# Patient Record
Sex: Female | Born: 1990 | Race: Black or African American | Hispanic: No | Marital: Single | Smoking: Former smoker
Health system: Southern US, Community
[De-identification: ages and names within clinical notes are randomized; demographics above are authoritative.]

## PROBLEM LIST (undated history)

## (undated) ENCOUNTER — Inpatient Hospital Stay (HOSPITAL_COMMUNITY): Payer: Self-pay

## (undated) DIAGNOSIS — A749 Chlamydial infection, unspecified: Secondary | ICD-10-CM

## (undated) DIAGNOSIS — A549 Gonococcal infection, unspecified: Secondary | ICD-10-CM

## (undated) DIAGNOSIS — E559 Vitamin D deficiency, unspecified: Secondary | ICD-10-CM

## (undated) HISTORY — PX: NO PAST SURGERIES: SHX2092

---

## 2010-02-05 ENCOUNTER — Emergency Department (HOSPITAL_COMMUNITY): Admission: EM | Admit: 2010-02-05 | Discharge: 2010-02-05 | Payer: Self-pay | Admitting: Family Medicine

## 2010-02-08 ENCOUNTER — Inpatient Hospital Stay (HOSPITAL_COMMUNITY): Admission: AD | Admit: 2010-02-08 | Discharge: 2010-02-08 | Payer: Self-pay | Admitting: Obstetrics & Gynecology

## 2010-07-04 ENCOUNTER — Inpatient Hospital Stay (HOSPITAL_COMMUNITY)
Admission: AD | Admit: 2010-07-04 | Discharge: 2010-07-04 | Payer: Self-pay | Source: Home / Self Care | Attending: Obstetrics & Gynecology | Admitting: Obstetrics & Gynecology

## 2010-07-05 ENCOUNTER — Other Ambulatory Visit: Payer: Self-pay | Admitting: Family Medicine

## 2010-07-05 DIAGNOSIS — O480 Post-term pregnancy: Secondary | ICD-10-CM

## 2010-07-07 ENCOUNTER — Other Ambulatory Visit: Payer: Self-pay

## 2010-07-07 ENCOUNTER — Inpatient Hospital Stay (HOSPITAL_COMMUNITY)
Admission: AD | Admit: 2010-07-07 | Discharge: 2010-07-10 | DRG: 774 | Disposition: A | Discharge status: Home / Self Care | Payer: Medicaid Other | Source: Ambulatory Visit | Attending: Family Medicine | Admitting: Family Medicine

## 2010-07-07 DIAGNOSIS — D649 Anemia, unspecified: Secondary | ICD-10-CM | POA: Diagnosis not present

## 2010-07-07 DIAGNOSIS — O4590 Premature separation of placenta, unspecified, unspecified trimester: Secondary | ICD-10-CM | POA: Diagnosis present

## 2010-07-07 DIAGNOSIS — O9903 Anemia complicating the puerperium: Secondary | ICD-10-CM | POA: Diagnosis not present

## 2010-07-07 LAB — CBC
HCT: 34.8 % — ABNORMAL LOW (ref 36.0–46.0)
Hemoglobin: 11.7 g/dL — ABNORMAL LOW (ref 12.0–15.0)
MCHC: 33.6 g/dL (ref 30.0–36.0)
MCV: 81.3 fL (ref 78.0–100.0)
WBC: 11.8 10*3/uL — ABNORMAL HIGH (ref 4.0–10.5)

## 2010-07-08 DIAGNOSIS — D649 Anemia, unspecified: Secondary | ICD-10-CM

## 2010-07-08 DIAGNOSIS — O4590 Premature separation of placenta, unspecified, unspecified trimester: Secondary | ICD-10-CM

## 2010-07-08 DIAGNOSIS — O9903 Anemia complicating the puerperium: Secondary | ICD-10-CM

## 2010-07-08 LAB — ABO/RH: ABO/RH(D): O POS

## 2010-07-09 LAB — CBC
HCT: 25.3 % — ABNORMAL LOW (ref 36.0–46.0)
Hemoglobin: 8.1 g/dL — ABNORMAL LOW (ref 12.0–15.0)
MCV: 84.9 fL (ref 78.0–100.0)
WBC: 13 10*3/uL — ABNORMAL HIGH (ref 4.0–10.5)

## 2010-07-11 ENCOUNTER — Ambulatory Visit (HOSPITAL_COMMUNITY): Payer: Medicaid Other | Attending: Family Medicine

## 2010-08-18 LAB — GC/CHLAMYDIA PROBE AMP, GENITAL
Chlamydia, DNA Probe: NEGATIVE
GC Probe Amp, Genital: NEGATIVE

## 2010-08-18 LAB — URINALYSIS, ROUTINE W REFLEX MICROSCOPIC
Glucose, UA: NEGATIVE mg/dL
Hgb urine dipstick: NEGATIVE
Specific Gravity, Urine: 1.02 (ref 1.005–1.030)

## 2010-08-18 LAB — WET PREP, GENITAL
Clue Cells Wet Prep HPF POC: NONE SEEN
Trich, Wet Prep: NONE SEEN
Yeast Wet Prep HPF POC: NONE SEEN

## 2013-02-05 ENCOUNTER — Emergency Department (HOSPITAL_COMMUNITY): Payer: Self-pay

## 2013-02-05 ENCOUNTER — Encounter (HOSPITAL_COMMUNITY): Payer: Self-pay | Admitting: Emergency Medicine

## 2013-02-05 ENCOUNTER — Emergency Department (HOSPITAL_COMMUNITY)
Admission: EM | Admit: 2013-02-05 | Discharge: 2013-02-05 | Disposition: A | Discharge status: Home / Self Care | Payer: Self-pay | Attending: Emergency Medicine | Admitting: Emergency Medicine

## 2013-02-05 DIAGNOSIS — Z8619 Personal history of other infectious and parasitic diseases: Secondary | ICD-10-CM | POA: Insufficient documentation

## 2013-02-05 DIAGNOSIS — Z3202 Encounter for pregnancy test, result negative: Secondary | ICD-10-CM | POA: Insufficient documentation

## 2013-02-05 DIAGNOSIS — N760 Acute vaginitis: Secondary | ICD-10-CM | POA: Insufficient documentation

## 2013-02-05 DIAGNOSIS — A499 Bacterial infection, unspecified: Secondary | ICD-10-CM | POA: Insufficient documentation

## 2013-02-05 DIAGNOSIS — N390 Urinary tract infection, site not specified: Secondary | ICD-10-CM | POA: Insufficient documentation

## 2013-02-05 DIAGNOSIS — B9689 Other specified bacterial agents as the cause of diseases classified elsewhere: Secondary | ICD-10-CM | POA: Insufficient documentation

## 2013-02-05 DIAGNOSIS — Z202 Contact with and (suspected) exposure to infections with a predominantly sexual mode of transmission: Secondary | ICD-10-CM | POA: Insufficient documentation

## 2013-02-05 DIAGNOSIS — R112 Nausea with vomiting, unspecified: Secondary | ICD-10-CM | POA: Insufficient documentation

## 2013-02-05 DIAGNOSIS — R51 Headache: Secondary | ICD-10-CM | POA: Insufficient documentation

## 2013-02-05 HISTORY — DX: Gonococcal infection, unspecified: A54.9

## 2013-02-05 HISTORY — DX: Chlamydial infection, unspecified: A74.9

## 2013-02-05 LAB — CBC WITH DIFFERENTIAL/PLATELET
Basophils Absolute: 0 10*3/uL (ref 0.0–0.1)
Eosinophils Absolute: 0.1 10*3/uL (ref 0.0–0.7)
Eosinophils Relative: 1 % (ref 0–5)
Lymphocytes Relative: 45 % (ref 12–46)
MCV: 83.1 fL (ref 78.0–100.0)
Platelets: 170 10*3/uL (ref 150–400)
RDW: 14.1 % (ref 11.5–15.5)
WBC: 4.6 10*3/uL (ref 4.0–10.5)

## 2013-02-05 LAB — URINE MICROSCOPIC-ADD ON

## 2013-02-05 LAB — COMPREHENSIVE METABOLIC PANEL
ALT: 8 U/L (ref 0–35)
AST: 18 U/L (ref 0–37)
CO2: 24 mEq/L (ref 19–32)
Calcium: 9 mg/dL (ref 8.4–10.5)
Sodium: 138 mEq/L (ref 135–145)
Total Protein: 7.3 g/dL (ref 6.0–8.3)

## 2013-02-05 LAB — WET PREP, GENITAL: Trich, Wet Prep: NONE SEEN

## 2013-02-05 LAB — POCT PREGNANCY, URINE: Preg Test, Ur: NEGATIVE

## 2013-02-05 LAB — URINALYSIS, ROUTINE W REFLEX MICROSCOPIC
Bilirubin Urine: NEGATIVE
Glucose, UA: NEGATIVE mg/dL
Ketones, ur: NEGATIVE mg/dL
Protein, ur: NEGATIVE mg/dL

## 2013-02-05 MED ORDER — CEPHALEXIN 500 MG PO CAPS: 500.0000 mg | ORAL_CAPSULE | Freq: Four times a day (QID) | ORAL | Status: DC

## 2013-02-05 MED ORDER — METRONIDAZOLE 500 MG PO TABS: 500.0000 mg | ORAL_TABLET | Freq: Two times a day (BID) | ORAL | Status: DC

## 2013-02-05 MED ORDER — AZITHROMYCIN 1 G PO PACK
1.0000 g | PACK | Freq: Once | ORAL | Status: AC
  Administered 2013-02-05: 1 g via ORAL
  Filled 2013-02-05: qty 1

## 2013-02-05 MED ORDER — CEFTRIAXONE SODIUM 250 MG IJ SOLR
250.0000 mg | Freq: Once | INTRAMUSCULAR | Status: AC
  Administered 2013-02-05: 250 mg via INTRAMUSCULAR
  Filled 2013-02-05: qty 250

## 2013-02-05 NOTE — ED Notes (Signed)
Per EMS: Pt was dx w/ gonorrhea/chlamydia x 1 wk ago.  States that she got better and is now having hematuria and low abd pain.  States it feels the same as when she had ovarian cysts and also when she was pregnant.

## 2013-02-05 NOTE — Progress Notes (Signed)
[facility] did not get to see patient but will be sending information about the [facility] Orange Card program, using the address provided.  °

## 2013-02-05 NOTE — ED Provider Notes (Signed)
CSN: [id number]     Arrival date & time 02/05/13  1038 History   First MD Initiated Contact with Patient 02/05/13 1042     Chief Complaint  Patient presents with  . SEXUALLY TRANSMITTED DISEASE  . Hematuria   (Consider location/radiation/quality/duration/timing/severity/associated sxs/prior Treatment) The history is provided by the patient. No language interpreter was used.  Kristin Garner is a 22 year old female, G1P1001,with past medical history of gonorrhea and chlamydia just recently diagnosed approximately one week ago-as per patient was given ceftriaxone and azithromycin for treatment-presenting to emergency department, brought in by EMS, with vaginal pain, bilateral pelvic pain, suprapubic pain, vaginal discharge, hematuria. Patient reported that the pelvic pain, suprapubic pain, vaginal pain started a couple of days ag. Patient described the pelvic discomfort to be a constant pressure and cramping sensation with radiation to the lower back. Patient reported that the vaginal discomfort is more of a burning sensation, reported that she feels that she has bumps located on her vagina-reported that the discomfort worsens with urination. Patient reported that she's been experiencing vaginal discharge, described as a white creamy consistency with a fishy odor it has been ongoing for the past 2-3 days. Patient reported that when she urinated this morning she had blood in her urine for when she went there is nothing new. Patient reported that she took ibuprofen last night reporting that the pain somewhat subsided, but woke up this morning with increased pain. Patient reported that she's been vomiting, reported that she vomited twice today. Stated that she's been feeling rather nauseous every morning for the past 2-3 days. Stated that she has associated symptoms of headache. Patient reported that her last period was 01/16/2013, stated that her periods are regular. When asked regarding sexually active patient  reported that she is sexually active, uses protection, denied birth control-reported that last sexual encounter was 01/03/2013. Denied fever, chills, abdominal pain, diarrhea, neck pain, neck stiffness, vaginal bleeding, numbness, tingling, chest pain shortness of breath, difficulty breathing, hematochezia, melena. PCP none  Past Medical History  Diagnosis Date  . Gonorrhea   . Chlamydia    No past surgical history on file. No family history on file. History  Substance Use Topics  . Smoking status: Not on file  . Smokeless tobacco: Not on file  . Alcohol Use: Not on file   OB History   Grav Para Term Preterm Abortions TAB SAB Ect Mult Living                 Review of Systems  Constitutional: Negative for fever and chills.  HENT: Negative for sore throat, trouble swallowing, neck pain and neck stiffness.   Eyes: Negative for visual disturbance.  Respiratory: Negative for shortness of breath.   Cardiovascular: Negative for chest pain.  Gastrointestinal: Positive for nausea and vomiting. Negative for abdominal pain, diarrhea, constipation, blood in stool and anal bleeding.  Genitourinary: Positive for hematuria, vaginal discharge, vaginal pain and pelvic pain. Negative for dysuria, decreased urine volume, vaginal bleeding and difficulty urinating.  Musculoskeletal: Positive for back pain.  Neurological: Positive for headaches. Negative for dizziness and weakness.  All other systems reviewed and are negative.    Allergies  Review of patient's allergies indicates no known allergies.  Home Medications   Current Outpatient Rx  Name  Route  Sig  Dispense  Refill  . ibuprofen (ADVIL,MOTRIN) 200 MG tablet   Oral   Take 400 mg by mouth every 8 (eight) hours as needed for pain.         Marland Kitchen  cephALEXin (KEFLEX) 500 MG capsule   Oral   Take 1 capsule (500 mg total) by mouth 4 (four) times daily.   40 capsule   0   . metroNIDAZOLE (FLAGYL) 500 MG tablet   Oral   Take 1 tablet  (500 mg total) by mouth 2 (two) times daily. One po bid x 7 days   14 tablet   0    BP 128/82  Pulse 70  Temp(Src) 98.2 F (36.8 C) (Oral)  Resp 20  SpO2 100% Physical Exam  Nursing note and vitals reviewed. Constitutional: She is oriented to person, place, and time. She appears well-developed and well-nourished. No distress.  HENT:  Head: Normocephalic and atraumatic.  Mouth/Throat: Oropharynx is clear and moist. No oropharyngeal exudate.  Negative oral lesions or sores noted to the gumline and buccal mucosa Negative erythema, inflammation, swelling, exudate, sores, lesions noted to the posterior oropharynx  Eyes: Conjunctivae and EOM are normal. Pupils are equal, round, and reactive to light. Right eye exhibits no discharge.  Neck: Normal range of motion. Neck supple.  Negative neck stiffness Negative nuchal rigidity Negative lymphadenopathy  Cardiovascular: Normal rate, regular rhythm and normal heart sounds.  Exam reveals no friction rub.   No murmur heard. Pulmonary/Chest: Effort normal and breath sounds normal. No respiratory distress. She has no wheezes. She has no rales.  Abdominal: Soft. Bowel sounds are normal. She exhibits no distension. There is tenderness. There is no rebound and no guarding.  Mild discomfort upon epigastric region  Tenderness upon palpation to the suprapubic region and bilateral adnexal discomfort Negative McBurney's point Negative Psoas and obturator sign  Genitourinary: Vaginal discharge found.  Mild swelling noted to the labia majora labia minora region. Negative inflammation, it erythema, sores, lesions noted to the external genitalia. Negative inflammation, lesions, sores, erythema, inflammation noted to the vaginal canal. Negative swelling, erythema, inflammation, strawberry appearance noted to the cervix. Negative blood in vaginal vault. Thick white discharge with no odor identified upon pelvic exam. Positive cervical motion tenderness  identified, positive bilateral adnexal tenderness upon palpation. Negative inguinal lymphadenopathy.  Exam chaperoned  Lymphadenopathy:    She has no cervical adenopathy.  Neurological: She is alert and oriented to person, place, and time. She exhibits normal muscle tone. Coordination normal.  Skin: Skin is warm and dry. No rash noted. She is not diaphoretic. No erythema.  Psychiatric: She has a normal mood and affect. Her behavior is normal. Thought content normal.    ED Course  Procedures (including critical care time)  1:00 PM Re-assessed patient. Patient presenting with right sided CVA tenderness - denied any history of kidney stones.   Labs Review Labs Reviewed  WET PREP, GENITAL - Abnormal; Notable for the following:    Clue Cells Wet Prep HPF POC MANY (*)    WBC, Wet Prep HPF POC FEW (*)    All other components within normal limits  URINALYSIS, ROUTINE W REFLEX MICROSCOPIC - Abnormal; Notable for the following:    APPearance CLOUDY (*)    Hgb urine dipstick LARGE (*)    Leukocytes, UA MODERATE (*)    All other components within normal limits  CBC WITH DIFFERENTIAL - Abnormal; Notable for the following:    Hemoglobin 11.4 (*)    HCT 35.3 (*)    All other components within normal limits  URINE MICROSCOPIC-ADD ON - Abnormal; Notable for the following:    Squamous Epithelial / LPF MANY (*)    Bacteria, UA MANY (*)    All other  components within normal limits  GC/CHLAMYDIA PROBE AMP  URINE CULTURE  COMPREHENSIVE METABOLIC PANEL  LIPASE, BLOOD  POCT PREGNANCY, URINE   Imaging Review US Renal  02/05/2013   CLINICAL DATA:  Right flank pain.  EXAM: RENAL/URINARY TRACT ULTRASOUND COMPLETE  COMPARISON:  None.  FINDINGS: Right Kidney: 10.6 cm. Normal size and echotexture. No focal abnormality. No hydronephrosis.  Left Kidney: 10.7 cm. Normal size and echotexture. No focal abnormality. No hydronephrosis.  Bladder:  Appears normal for degree of bladder distention.  IMPRESSION:  Normal study.   Electronically Signed   By: Charlett Nose   On: 02/05/2013 13:50    MDM   1. Bacterial vaginosis   2. Possible exposure to STD   3. UTI (urinary tract infection)     Patient presenting to emergency apartment with vaginal pain, suprapubic pain, bilateral pelvic pain with vaginal discharge that has been ongoing for the past week. Patient was recently seen and diagnosed last week with gonorrhea Chlamydia infection, patient was treated prophylactically in the emergency department. Alert and oriented. Negative acute abdomen, negative peritoneal signs. Mild discomfort upon palpation to epigastric region. Bowel sounds normoactive in all 4 quadrants. Soft. Discomfort upon palpation to suprapubic and bilateral pelvic regions. Mild swelling noted to the external genitalia. Negative sores, lesions, inflammation, swelling, erythema noted to the vaginal canal and cervix. Thick white discharge identified upon pelvic exam with no odor identified. CMT tenderness and bilateral adnexal tenderness noted upon palpation. CBC negative findings. CMP negative findings. Lipase negative elevation. Urine pregnancy negative. Urine large amount of hemoglobin identified with moderate leukocytosis and many bacteria identified in urine. Urine culture pending. Ultrasound negative identification of stones or hydronephrosis. Patient stable, afebrile. Patient given antibiotics prophylactically while in ED setting for GC/Chlamydia. Positive clue cells identified-bacterial vaginosis identified. Hemoglobin in urine suspicion to be possible passing stone with mild right-sided CVA tenderness identified. Urine identified numerous amounts of bacteria, possible urinary tract infection. Discharged patient with antibiotics for urinary tract infection-Keflex-and bacterial vaginosis-Flagyl. Referred patient to [facility] to be reevaluated. Discussed with patient to refrain from any sexual activity. Referred patient to STD clinic  for further followup and to be reevaluated-encouraged patient to followup with STD clinic for further testing to be performed. Discussed with patient to continue monitor symptoms and if symptoms are to worsen or change report back to emergency department gastric return instructions given.  Patient to plan of care, understood, all questions answered.   Raymon Mutton, PA-C 02/05/13 1845  Medical screening examination/treatment/procedure(s) were conducted as a shared visit with non-physician practitioner(s) or resident and myself. I personally evaluated the patient during the encounter and agree with the findings and plan unless otherwise indicated.  Hematuria and lower pelvic pain since treatment for STD G/C 1 wk prior. No fevers or vomiting, feels similar to ovarian cyst hx. Pain mild, worse with palpation. Vaginal discharge. No new sexual partners. Pt has been tested for HIV, neg per pt. Mild pelvic/ suprapubic pain, no guarding, well appearing, mmm. Plan for pelvic exam, UA/ pregnancy and PID treatment. UTI and BV.  Outpt fup discused.    Enid Skeens, MD 02/06/13 1255

## 2013-02-06 LAB — URINE CULTURE: Colony Count: >=100000

## 2013-02-06 LAB — GC/CHLAMYDIA PROBE AMP: CT Probe RNA: NEGATIVE

## 2013-05-05 ENCOUNTER — Emergency Department (HOSPITAL_COMMUNITY)
Admission: EM | Admit: 2013-05-05 | Discharge: 2013-05-05 | Disposition: A | Discharge status: Home / Self Care | Payer: Medicaid Other | Attending: Emergency Medicine | Admitting: Emergency Medicine

## 2013-05-05 ENCOUNTER — Encounter (HOSPITAL_COMMUNITY): Payer: Self-pay | Admitting: Emergency Medicine

## 2013-05-05 DIAGNOSIS — R51 Headache: Secondary | ICD-10-CM | POA: Insufficient documentation

## 2013-05-05 DIAGNOSIS — O210 Mild hyperemesis gravidarum: Secondary | ICD-10-CM | POA: Insufficient documentation

## 2013-05-05 DIAGNOSIS — R112 Nausea with vomiting, unspecified: Secondary | ICD-10-CM

## 2013-05-05 DIAGNOSIS — Z8619 Personal history of other infectious and parasitic diseases: Secondary | ICD-10-CM | POA: Insufficient documentation

## 2013-05-05 DIAGNOSIS — Z3490 Encounter for supervision of normal pregnancy, unspecified, unspecified trimester: Secondary | ICD-10-CM

## 2013-05-05 DIAGNOSIS — R509 Fever, unspecified: Secondary | ICD-10-CM | POA: Insufficient documentation

## 2013-05-05 DIAGNOSIS — Z87891 Personal history of nicotine dependence: Secondary | ICD-10-CM | POA: Insufficient documentation

## 2013-05-05 LAB — URINE MICROSCOPIC-ADD ON

## 2013-05-05 LAB — URINALYSIS, ROUTINE W REFLEX MICROSCOPIC
Hgb urine dipstick: NEGATIVE
Specific Gravity, Urine: 1.026 (ref 1.005–1.030)
Urobilinogen, UA: 1 mg/dL (ref 0.0–1.0)
pH: 6 (ref 5.0–8.0)

## 2013-05-05 LAB — POCT PREGNANCY, URINE: Preg Test, Ur: POSITIVE — AB

## 2013-05-05 MED ORDER — ONDANSETRON 4 MG PO TBDP
4.0000 mg | ORAL_TABLET | Freq: Once | ORAL | Status: AC
  Administered 2013-05-05: 4 mg via ORAL
  Filled 2013-05-05: qty 1

## 2013-05-05 MED ORDER — ONDANSETRON 4 MG PO TBDP: 4.0000 mg | ORAL_TABLET | Freq: Three times a day (TID) | ORAL | Status: DC | PRN

## 2013-05-05 MED ORDER — ACETAMINOPHEN 325 MG PO TABS
650.0000 mg | ORAL_TABLET | Freq: Once | ORAL | Status: AC
  Administered 2013-05-05: 650 mg via ORAL
  Filled 2013-05-05: qty 2

## 2013-05-05 NOTE — ED Notes (Addendum)
Pt c/o nausea and vomiting since this am.  St's she may be pregnant, did not have a period in Nov.  Denies any diarrhea.   Also c/o lower back pain x's 3 weeks and headache x's 2 days

## 2013-05-05 NOTE — ED Provider Notes (Signed)
Medical screening examination/treatment/procedure(s) were performed by non-physician practitioner and as supervising physician I was immediately available for consultation/collaboration.  EKG Interpretation   None         Gilda Crease, MD 05/05/13 2245

## 2013-05-05 NOTE — ED Provider Notes (Signed)
CSN: [id number]     Arrival date & time 05/05/13  1807 History   First MD Initiated Contact with Patient 05/05/13 2152     Chief Complaint  Patient presents with  . Emesis   (Consider location/radiation/quality/duration/timing/severity/associated sxs/prior Treatment) HPI Comments: Patient's LMP was October 15, took 2 home pregnancy test first negative the last taken 3 days ago positive since that time has had intermittent bouts of nausea and vomiting  Today worse.  Uses no form of birth control has a 22 years old-- normal pregnancy and delivery.  States today has a headache- has not taken any meds, denies abdominal pain , dysuria, vaginal discharge, vaginal bleeding   Patient is a 22 y.o. female presenting with vomiting. The history is provided by the patient.  Emesis Severity:  Mild Duration:  3 days Timing:  Intermittent Quality:  Bilious material Progression:  Worsening Chronicity:  New Recent urination:  Normal Relieved by:  None tried Worsened by:  Food smell Ineffective treatments:  None tried Associated symptoms: headaches   Associated symptoms: no abdominal pain, no chills, no diarrhea, no fever and no myalgias   Headaches:    Severity:  Mild   Onset quality:  Gradual   Duration:  1 day   Timing:  Unable to specify   Past Medical History  Diagnosis Date  . Gonorrhea   . Chlamydia    History reviewed. No pertinent past surgical history. No family history on file. History  Substance Use Topics  . Smoking status: Former Games developer  . Smokeless tobacco: Not on file  . Alcohol Use: No   OB History   Grav Para Term Preterm Abortions TAB SAB Ect Mult Living                 Review of Systems  Constitutional: Positive for fever. Negative for chills.  Eyes: Negative for visual disturbance.  Respiratory: Negative for shortness of breath.   Gastrointestinal: Positive for nausea and vomiting. Negative for abdominal pain and diarrhea.  Genitourinary: Negative for dysuria,  frequency, vaginal bleeding and vaginal discharge.  Musculoskeletal: Negative for myalgias.  Neurological: Positive for headaches. Negative for dizziness.  All other systems reviewed and are negative.    Allergies  Review of patient's allergies indicates no known allergies.  Home Medications   Current Outpatient Rx  Name  Route  Sig  Dispense  Refill  . ondansetron (ZOFRAN-ODT) 4 MG disintegrating tablet   Oral   Take 1 tablet (4 mg total) by mouth every 8 (eight) hours as needed for nausea or vomiting.   20 tablet   0    BP 129/49  Pulse 81  Temp(Src) 98.4 F (36.9 C) (Oral)  Resp 18  Wt 125 lb 8 oz (56.926 kg)  SpO2 100%  LMP 03/19/2013 Physical Exam  Nursing note and vitals reviewed. Constitutional: She is oriented to person, place, and time. She appears well-nourished.  HENT:  Head: Normocephalic.  Eyes: Pupils are equal, round, and reactive to light.  Neck: Normal range of motion.  Cardiovascular: Normal rate.   Pulmonary/Chest: Effort normal and breath sounds normal.  Neurological: She is alert and oriented to person, place, and time.  Skin: Skin is warm and dry.    ED Course  Procedures (including critical care time) Labs Review Labs Reviewed  URINALYSIS, ROUTINE W REFLEX MICROSCOPIC - Abnormal; Notable for the following:    APPearance CLOUDY (*)    Ketones, ur 15 (*)    Leukocytes, UA SMALL (*)  All other components within normal limits  URINE MICROSCOPIC-ADD ON - Abnormal; Notable for the following:    Squamous Epithelial / LPF MANY (*)    All other components within normal limits  POCT PREGNANCY, URINE - Abnormal; Notable for the following:    Preg Test, Ur POSITIVE (*)    All other components within normal limits  URINE CULTURE   Imaging Review No results found.  EKG Interpretation   None       MDM   1. Pregnancy   2. Nausea & vomiting        Arman Filter, NP 05/05/13 2241

## 2013-05-07 LAB — URINE CULTURE

## 2013-05-12 ENCOUNTER — Inpatient Hospital Stay (HOSPITAL_COMMUNITY): Payer: Medicaid Other

## 2013-05-12 ENCOUNTER — Inpatient Hospital Stay (HOSPITAL_COMMUNITY)
Admission: AD | Admit: 2013-05-12 | Discharge: 2013-05-12 | Disposition: A | Discharge status: Home / Self Care | Payer: Medicaid Other | Source: Ambulatory Visit | Attending: Obstetrics & Gynecology | Admitting: Obstetrics & Gynecology

## 2013-05-12 ENCOUNTER — Encounter (HOSPITAL_COMMUNITY): Payer: Self-pay | Admitting: *Deleted

## 2013-05-12 DIAGNOSIS — Z1389 Encounter for screening for other disorder: Secondary | ICD-10-CM

## 2013-05-12 DIAGNOSIS — Z3490 Encounter for supervision of normal pregnancy, unspecified, unspecified trimester: Secondary | ICD-10-CM

## 2013-05-12 DIAGNOSIS — R109 Unspecified abdominal pain: Secondary | ICD-10-CM | POA: Insufficient documentation

## 2013-05-12 DIAGNOSIS — O210 Mild hyperemesis gravidarum: Secondary | ICD-10-CM | POA: Insufficient documentation

## 2013-05-12 DIAGNOSIS — O219 Vomiting of pregnancy, unspecified: Secondary | ICD-10-CM

## 2013-05-12 LAB — CBC
Platelets: 190 10*3/uL (ref 150–400)
RBC: 4.12 MIL/uL (ref 3.87–5.11)
WBC: 11.2 10*3/uL — ABNORMAL HIGH (ref 4.0–10.5)

## 2013-05-12 LAB — URINALYSIS, ROUTINE W REFLEX MICROSCOPIC
Hgb urine dipstick: NEGATIVE
Nitrite: NEGATIVE
Specific Gravity, Urine: >1.03 — ABNORMAL HIGH (ref 1.005–1.030)
Urobilinogen, UA: 0.2 mg/dL (ref 0.0–1.0)
pH: 6 (ref 5.0–8.0)

## 2013-05-12 LAB — WET PREP, GENITAL: Trich, Wet Prep: NONE SEEN

## 2013-05-12 LAB — HCG, QUANTITATIVE, PREGNANCY: hCG, Beta Chain, Quant, S: 140011 m[IU]/mL — ABNORMAL HIGH (ref <5)

## 2013-05-12 MED ORDER — PROMETHAZINE HCL 25 MG PO TABS: 12.5000 mg | ORAL_TABLET | Freq: Four times a day (QID) | ORAL | Status: DC | PRN

## 2013-05-12 NOTE — MAU Note (Signed)
Pt had + preg test at [facility] on Dec 1st. Started with low abd cramping last night, did not take anything for pain. Denies vag bleeding or discharge.

## 2013-05-12 NOTE — MAU Provider Note (Signed)
Chief Complaint: No chief complaint on file.   First Provider Initiated Contact with Patient 05/12/13 1848     SUBJECTIVE HPI: [name] is a 22 y.o. G2P1001 at [redacted]w[redacted]d by LMP who presents to maternity admissions reporting abdominal cramping x2-3 days.  She reports vomiting x1 today and she saw scant bleeding in emesis.  She denies epigastric pain/heartburn.  She denies vaginal bleeding, vaginal itching/burning/odor, urinary symptoms, h/a, dizziness, n/v, or fever/chills.     Patient's last menstrual period was 03/19/2013.  Past Medical History  Diagnosis Date  . Gonorrhea   . Chlamydia    Past Surgical History  Procedure Laterality Date  . No past surgeries     History   Social History  . Marital Status: Single    Spouse Name: N/A    Number of Children: N/A  . Years of Education: N/A   Occupational History  . Not on file.   Social History Main Topics  . Smoking status: Former Smoker -- 0.25 packs/day for 1 years    Types: Cigarettes    Quit date: 02/10/2013  . Smokeless tobacco: Not on file  . Alcohol Use: Yes     Comment: last use Nov 2014  . Drug Use: Yes    Special: Marijuana     Comment: last use Oct 2014  . Sexual Activity: Yes     Comment: Last sex in October   Other Topics Concern  . Not on file   Social History Narrative  . No narrative on file   No current facility-administered medications on file prior to encounter.   Current Outpatient Prescriptions on File Prior to Encounter  Medication Sig Dispense Refill  . ondansetron (ZOFRAN-ODT) 4 MG disintegrating tablet Take 1 tablet (4 mg total) by mouth every 8 (eight) hours as needed for nausea or vomiting.  20 tablet  0   No Known Allergies  ROS: Pertinent items in HPI  OBJECTIVE Blood pressure 120/68, pulse 81, temperature 99.3 F (37.4 C), temperature source Oral, resp. rate 16, height 5\' 5"  (1.651 m), weight 56.7 kg (125 lb), last menstrual period 03/19/2013. GENERAL: Well-developed,  well-nourished female in no acute distress.  HEENT: Normocephalic HEART: normal rate RESP: normal effort ABDOMEN: Soft, non-tender EXTREMITIES: Nontender, no edema NEURO: Alert and oriented Pelvic exam: Cervix pink, visually closed, without lesion, scant white creamy discharge, vaginal walls and external genitalia normal Bimanual exam: Cervix 0/long/high, firm, anterior, neg CMT, uterus nontender, nonenlarged, adnexa without tenderness, enlargement, or mass  LAB RESULTS Results for orders placed during the hospital encounter of 05/12/13 (from the past 24 hour(s))  URINALYSIS, ROUTINE W REFLEX MICROSCOPIC     Status: Abnormal   Collection Time    05/12/13  6:00 PM      Result Value Range   Color, Urine YELLOW  YELLOW   APPearance HAZY (*) CLEAR   Specific Gravity, Urine >1.030 (*) 1.005 - 1.030   pH 6.0  5.0 - 8.0   Glucose, UA NEGATIVE  NEGATIVE mg/dL   Hgb urine dipstick NEGATIVE  NEGATIVE   Bilirubin Urine NEGATIVE  NEGATIVE   Ketones, ur NEGATIVE  NEGATIVE mg/dL   Protein, ur NEGATIVE  NEGATIVE mg/dL   Urobilinogen, UA 0.2  0.0 - 1.0 mg/dL   Nitrite NEGATIVE  NEGATIVE   Leukocytes, UA NEGATIVE  NEGATIVE  WET PREP, GENITAL     Status: Abnormal   Collection Time    05/12/13  6:52 PM      Result Value Range   Yeast Wet  Prep HPF POC NONE SEEN  NONE SEEN   Trich, Wet Prep NONE SEEN  NONE SEEN   Clue Cells Wet Prep HPF POC FEW (*) NONE SEEN   WBC, Wet Prep HPF POC FEW (*) NONE SEEN  CBC     Status: Abnormal   Collection Time    05/12/13  6:53 PM      Result Value Range   WBC 11.2 (*) 4.0 - 10.5 K/uL   RBC 4.12  3.87 - 5.11 MIL/uL   Hemoglobin 11.2 (*) 12.0 - 15.0 g/dL   HCT 16.1 (*) 09.6 - 04.5 %   MCV 81.6  78.0 - 100.0 fL   MCH 27.2  26.0 - 34.0 pg   MCHC 33.3  30.0 - 36.0 g/dL   RDW 40.9  81.1 - 91.4 %   Platelets 190  150 - 400 K/uL  HCG, QUANTITATIVE, PREGNANCY     Status: Abnormal   Collection Time    05/12/13  6:53 PM      Result Value Range   hCG, Beta Chain,  Quant, S 140011 (*) <5 mIU/mL    IMAGING US Ob Comp Less 14 Wks  05/12/2013   CLINICAL DATA:  Abdominal pain for 1 day. No history of hypertension or gestational diabetes. By LMP, the patient is 7 weeks 5 days. EDC by LMP is 12/24/2013.  EXAM: OBSTETRIC <14 WK ULTRASOUND  TECHNIQUE: Transabdominal ultrasound was performed for evaluation of the gestation as well as the maternal uterus and adnexal regions.  COMPARISON:  None.  FINDINGS: Intrauterine gestational sac: Visualized/normal in shape.  Yolk sac:  Present  Embryo:  Present  Cardiac Activity: Present  Heart Rate: 174 bpm  CRL:   17.2  mm   8 w 2d                  [place] EDC: 12/20/2012  Maternal uterus/adnexae: No subchorionic hemorrhage identified. Note is made of right corpus luteum cyst. The left ovary has a normal appearance. No free pelvic fluid.  IMPRESSION: 1. Intrauterine embryo at 8 weeks 2 days by ultrasound. 2. Size and dates correlate well, confirming clinical dating of 7 weeks 5 days.   Electronically Signed   By: Rosalie Gums M.D.   On: 05/12/2013 20:28   ASSESSMENT 1. Normal IUP (intrauterine pregnancy) on prenatal ultrasound   2. Nausea/vomiting in pregnancy     PLAN Discharge home Phenergan 12.5-25 mg PO sent to pt pharmacy.  Pt has Zofran prescription but has not picked up r/t cost.   Message sent to [facility] for appointment Return to MAU as needed    Medication List         ondansetron 4 MG disintegrating tablet  Commonly known as:  ZOFRAN-ODT  Take 1 tablet (4 mg total) by mouth every 8 (eight) hours as needed for nausea or vomiting.     promethazine 25 MG tablet  Commonly known as:  PHENERGAN  Take 0.5-1 tablets (12.5-25 mg total) by mouth every 6 (six) hours as needed for nausea or vomiting.       Follow-up Information   Follow up with [facility]. (The clinic will call you to make an appointment or call the number listed below.  Return to MAU as needed.)    Specialty:  Obstetrics and Gynecology    Contact information:   [address] [place] [address] [phone]      Sharen Counter Certified Nurse-Midwife 05/12/2013  8:36 PM

## 2013-07-15 ENCOUNTER — Emergency Department (HOSPITAL_COMMUNITY)
Admission: EM | Admit: 2013-07-15 | Discharge: 2013-07-15 | Disposition: A | Discharge status: Home / Self Care | Payer: Medicaid Other | Attending: Emergency Medicine | Admitting: Emergency Medicine

## 2013-07-15 ENCOUNTER — Encounter (HOSPITAL_COMMUNITY): Payer: Self-pay | Admitting: Emergency Medicine

## 2013-07-15 DIAGNOSIS — R1110 Vomiting, unspecified: Secondary | ICD-10-CM

## 2013-07-15 DIAGNOSIS — O210 Mild hyperemesis gravidarum: Secondary | ICD-10-CM

## 2013-07-15 DIAGNOSIS — K226 Gastro-esophageal laceration-hemorrhage syndrome: Secondary | ICD-10-CM

## 2013-07-15 DIAGNOSIS — O98519 Other viral diseases complicating pregnancy, unspecified trimester: Secondary | ICD-10-CM | POA: Insufficient documentation

## 2013-07-15 DIAGNOSIS — A5400 Gonococcal infection of lower genitourinary tract, unspecified: Secondary | ICD-10-CM | POA: Insufficient documentation

## 2013-07-15 DIAGNOSIS — A749 Chlamydial infection, unspecified: Secondary | ICD-10-CM | POA: Insufficient documentation

## 2013-07-15 DIAGNOSIS — Z87891 Personal history of nicotine dependence: Secondary | ICD-10-CM | POA: Insufficient documentation

## 2013-07-15 DIAGNOSIS — O98219 Gonorrhea complicating pregnancy, unspecified trimester: Secondary | ICD-10-CM | POA: Insufficient documentation

## 2013-07-15 LAB — URINE MICROSCOPIC-ADD ON

## 2013-07-15 LAB — URINALYSIS, ROUTINE W REFLEX MICROSCOPIC
Bilirubin Urine: NEGATIVE
Glucose, UA: NEGATIVE mg/dL
Hgb urine dipstick: NEGATIVE
Ketones, ur: NEGATIVE mg/dL
NITRITE: NEGATIVE
PROTEIN: NEGATIVE mg/dL
SPECIFIC GRAVITY, URINE: 1.017 (ref 1.005–1.030)
UROBILINOGEN UA: 0.2 mg/dL (ref 0.0–1.0)
pH: 7 (ref 5.0–8.0)

## 2013-07-15 LAB — CBC WITH DIFFERENTIAL/PLATELET
BASOS ABS: 0 10*3/uL (ref 0.0–0.1)
Basophils Relative: 0 % (ref 0–1)
EOS ABS: 0.3 10*3/uL (ref 0.0–0.7)
EOS PCT: 3 % (ref 0–5)
HCT: 33.8 % — ABNORMAL LOW (ref 36.0–46.0)
Hemoglobin: 11.1 g/dL — ABNORMAL LOW (ref 12.0–15.0)
LYMPHS ABS: 2.8 10*3/uL (ref 0.7–4.0)
LYMPHS PCT: 26 % (ref 12–46)
MCH: 28.2 pg (ref 26.0–34.0)
MCHC: 32.8 g/dL (ref 30.0–36.0)
MCV: 85.8 fL (ref 78.0–100.0)
Monocytes Absolute: 0.8 10*3/uL (ref 0.1–1.0)
Monocytes Relative: 7 % (ref 3–12)
NEUTROS PCT: 65 % (ref 43–77)
Neutro Abs: 7.1 10*3/uL (ref 1.7–7.7)
PLATELETS: 238 10*3/uL (ref 150–400)
RBC: 3.94 MIL/uL (ref 3.87–5.11)
RDW: 14.2 % (ref 11.5–15.5)
WBC: 11 10*3/uL — AB (ref 4.0–10.5)

## 2013-07-15 MED ORDER — ONDANSETRON 4 MG PO TBDP: 4.0000 mg | ORAL_TABLET | Freq: Three times a day (TID) | ORAL | Status: DC | PRN

## 2013-07-15 MED ORDER — SODIUM CHLORIDE 0.9 % IV BOLUS (SEPSIS)
1000.0000 mL | Freq: Once | INTRAVENOUS | Status: AC
  Administered 2013-07-15: 1000 mL via INTRAVENOUS

## 2013-07-15 MED ORDER — ONDANSETRON HCL 4 MG/2ML IJ SOLN
4.0000 mg | Freq: Once | INTRAMUSCULAR | Status: AC
  Administered 2013-07-15: 4 mg via INTRAVENOUS
  Filled 2013-07-15: qty 2

## 2013-07-15 NOTE — Discharge Instructions (Signed)
Mallory-Weiss Syndrome °Mallory-Weiss syndrome refers to bleeding from tears in the lining of the esophagus near where it meets the stomach. This is often caused by forceful vomiting, retching or coughing. This condition is often associated with alcoholism. Usually the bleeding stops by itself after 24 to 48 hours. Sometimes endoscopic or surgical treatment is needed. This condition is not usually fatal. °SYMPTOMS °· Vomiting of bright red or black coffee ground like material. °· Black, tarry stools. °· Low blood pressure causing you to feel faint or experience loss of consciousness. °DIAGNOSIS  °Definitive diagnosis is by endoscopy. Treatment is usually supportive. Persistent bleeding is uncommon. Sometimes cauterization or injection of epinephrine to stop the bleeding is used during the diagnostic endoscopy. Embolization (obstruction) of the arteries supplying the area of bleeding is sometimes used to stop the bleeding. °· An NG tube (naso-gastric tube) may be inserted to determine where the bleeding is coming from. °· Often an EGD (esophagogastroduodenoscopy) is done. In this procedure there is a small flexible tube-like telescope (endoscope) put into your mouth, through your esophagus (the food tube leading from your mouth to your stomach), down into your stomach and into the small bowel. Through this your caregiver can see what and where the problem is. °TREATMENT  °It is necessary to stop the bleeding as soon as possible. During the EGD, your caregiver may inject medication into bleeding vessels to clot them.  °SEEK IMMEDIATE MEDICAL CARE IF: °· You have persistent dizziness, lightheadedness, or fainting. °· Your vomiting returns and you have blood in your stools. °· You have vomit that is bright red blood or black coffee ground-like blood, bright red blood in the stool or black tarry stools. °· You have chest pain. °· You cannot eat or drink. °· You have nausea or vomiting. °MAKE SURE YOU:  °· Understand  these instructions. °· Will watch your condition. °· Will get help right away if you are not doing well or get worse. °Document Released: 10/09/2005 Document Revised: 08/14/2011 Document Reviewed: 11/13/2005 °[employer]® Patient Information ©2014 [employer]. ° °

## 2013-07-15 NOTE — ED Notes (Signed)
Pt states she is 4 months pregnant.

## 2013-07-15 NOTE — ED Provider Notes (Signed)
CSN: [id number]     Arrival date & time 07/15/13  1851 History   First MD Initiated Contact with Patient 07/15/13 2234     Chief Complaint  Patient presents with  . Emesis      HPI  Patient presents at 4 months pregnant with an episode of emesis. She vomited "just food". She had some retching and dry heaving over the next 10 minutes and had a second episode of emesis with some blood. She feels "fine" was given some Zofran upon arrival here. As I examine her she is asymptomatic. Currently 4 months pregnant. Had an ultrasound at 8 weeks. Cannot recall the results of this. She has no pain. She has a urinary symptoms. She feels well.  Syncope no lightheadedness no vaginal bleeding or discharge no pelvic pain.  Past Medical History  Diagnosis Date  . Gonorrhea   . Chlamydia    Past Surgical History  Procedure Laterality Date  . No past surgeries     No family history on file. History  Substance Use Topics  . Smoking status: Former Smoker -- 0.25 packs/day for 1 years    Types: Cigarettes    Quit date: 02/10/2013  . Smokeless tobacco: Not on file  . Alcohol Use: Yes     Comment: last use Nov 2014   OB History   Grav Para Term Preterm Abortions TAB SAB Ect Mult Living   2 1 1       1      Review of Systems  Constitutional: Negative for fever, chills, diaphoresis, appetite change and fatigue.  HENT: Negative for mouth sores, sore throat and trouble swallowing.   Eyes: Negative for visual disturbance.  Respiratory: Negative for cough, chest tightness, shortness of breath and wheezing.   Cardiovascular: Negative for chest pain.  Gastrointestinal: Positive for nausea and vomiting. Negative for abdominal pain, diarrhea and abdominal distention.       Hematemesis  Endocrine: Negative for polydipsia, polyphagia and polyuria.  Genitourinary: Negative for dysuria, frequency and hematuria.  Musculoskeletal: Negative for gait problem.  Skin: Negative for color change, pallor and rash.   Neurological: Negative for dizziness, syncope, light-headedness and headaches.  Hematological: Does not bruise/bleed easily.  Psychiatric/Behavioral: Negative for behavioral problems and confusion.      Allergies  Review of patient's allergies indicates no known allergies.  Home Medications   Current Outpatient Rx  Name  Route  Sig  Dispense  Refill  . ondansetron (ZOFRAN ODT) 4 MG disintegrating tablet   Oral   Take 1 tablet (4 mg total) by mouth every 8 (eight) hours as needed for nausea.   6 tablet   0    BP 110/62  Pulse 77  Temp(Src) 98.8 F (37.1 C) (Oral)  Resp 16  Ht 5\' 4"  (1.626 m)  Wt 135 lb (61.236 kg)  BMI 23.16 kg/m2  SpO2 100%  LMP 03/19/2013 Physical Exam  Constitutional: She is oriented to person, place, and time. She appears well-developed and well-nourished. No distress.  HENT:  Head: Normocephalic.  Eyes: Conjunctivae are normal. Pupils are equal, round, and reactive to light. No scleral icterus.  Neck: Normal range of motion. Neck supple. No thyromegaly present.  Cardiovascular: Normal rate and regular rhythm.  Exam reveals no gallop and no friction rub.   No murmur heard. Pulmonary/Chest: Effort normal and breath sounds normal. No respiratory distress. She has no wheezes. She has no rales.  Abdominal: Soft. Bowel sounds are normal. She exhibits no distension. There is no tenderness.  There is no rebound.  Abdomen soft nondistended nontender. Limited bedside ultrasound shows intrauterine pregnancy with positive fetal heart tones.  Musculoskeletal: Normal range of motion.  Neurological: She is alert and oriented to person, place, and time.  Skin: Skin is warm and dry. No rash noted.  Psychiatric: She has a normal mood and affect. Her behavior is normal.    ED Course  Procedures (including critical care time) Labs Review Labs Reviewed  URINALYSIS, ROUTINE W REFLEX MICROSCOPIC - Abnormal; Notable for the following:    APPearance CLOUDY (*)     Leukocytes, UA SMALL (*)    All other components within normal limits  CBC WITH DIFFERENTIAL - Abnormal; Notable for the following:    WBC 11.0 (*)    Hemoglobin 11.1 (*)    HCT 33.8 (*)    All other components within normal limits  URINE MICROSCOPIC-ADD ON - Abnormal; Notable for the following:    Squamous Epithelial / LPF FEW (*)    All other components within normal limits   Imaging Review No results found.  EKG Interpretation   None       MDM   Final diagnoses:  Mallory-Weiss tear  Vomiting  Hyperemesis gravidarum    Patient feeling better by the time of my initial evaluation. Patient denied. Drinking fluids. Urine shows a few or RBCs and a few WBCs the squamous epithelial cells and nitrate negative she's asymptomatic with urinary symptoms. Patient appropriate for outpatient treatment. Plan is to liquids and slowly advance her diet. Zofran. Diagnoses #1 vomiting next number is probable Mallory-Weiss tear    Rolland PorterMark Karol Skarzynski, MD 07/15/13 2340

## 2013-07-15 NOTE — ED Notes (Signed)
Pt reports she was suppose to be treated for gonorrhea on Thursday, pt reports she tested positive two weeks ago and was informed to get treatment.

## 2013-07-15 NOTE — ED Notes (Addendum)
Pt to ED via [facility] with c/o vomiting bright red blood.  St's she is 4 months preg.  Denies any vag. Bleeding or discharge.  St's she vomited x's 2 PTA. Pt also c/o pain in lower back.

## 2013-08-02 ENCOUNTER — Encounter (HOSPITAL_COMMUNITY): Payer: Self-pay

## 2013-08-02 ENCOUNTER — Inpatient Hospital Stay (HOSPITAL_COMMUNITY)
Admission: AD | Admit: 2013-08-02 | Discharge: 2013-08-02 | Disposition: A | Discharge status: Home / Self Care | Payer: Medicaid Other | Source: Ambulatory Visit | Attending: Obstetrics and Gynecology | Admitting: Obstetrics and Gynecology

## 2013-08-02 DIAGNOSIS — O2390 Unspecified genitourinary tract infection in pregnancy, unspecified trimester: Secondary | ICD-10-CM | POA: Insufficient documentation

## 2013-08-02 DIAGNOSIS — A499 Bacterial infection, unspecified: Secondary | ICD-10-CM | POA: Insufficient documentation

## 2013-08-02 DIAGNOSIS — R109 Unspecified abdominal pain: Secondary | ICD-10-CM | POA: Insufficient documentation

## 2013-08-02 DIAGNOSIS — O219 Vomiting of pregnancy, unspecified: Secondary | ICD-10-CM

## 2013-08-02 DIAGNOSIS — N760 Acute vaginitis: Secondary | ICD-10-CM | POA: Insufficient documentation

## 2013-08-02 DIAGNOSIS — O210 Mild hyperemesis gravidarum: Secondary | ICD-10-CM | POA: Insufficient documentation

## 2013-08-02 DIAGNOSIS — B9689 Other specified bacterial agents as the cause of diseases classified elsewhere: Secondary | ICD-10-CM | POA: Insufficient documentation

## 2013-08-02 DIAGNOSIS — Z87891 Personal history of nicotine dependence: Secondary | ICD-10-CM | POA: Insufficient documentation

## 2013-08-02 LAB — URINALYSIS, ROUTINE W REFLEX MICROSCOPIC
BILIRUBIN URINE: NEGATIVE
GLUCOSE, UA: NEGATIVE mg/dL
Hgb urine dipstick: NEGATIVE
KETONES UR: NEGATIVE mg/dL
LEUKOCYTES UA: NEGATIVE
Nitrite: NEGATIVE
PH: 6 (ref 5.0–8.0)
PROTEIN: NEGATIVE mg/dL
Specific Gravity, Urine: >1.03 — ABNORMAL HIGH (ref 1.005–1.030)
Urobilinogen, UA: 0.2 mg/dL (ref 0.0–1.0)

## 2013-08-02 LAB — WET PREP, GENITAL
Trich, Wet Prep: NONE SEEN
Yeast Wet Prep HPF POC: NONE SEEN

## 2013-08-02 MED ORDER — PROMETHAZINE HCL 12.5 MG PO TABS: 12.5000 mg | ORAL_TABLET | Freq: Four times a day (QID) | ORAL | Status: DC | PRN

## 2013-08-02 MED ORDER — PROMETHAZINE HCL 25 MG/ML IJ SOLN
25.0000 mg | Freq: Once | INTRAMUSCULAR | Status: AC
  Administered 2013-08-02: 25 mg via INTRAMUSCULAR
  Filled 2013-08-02: qty 1

## 2013-08-02 MED ORDER — METRONIDAZOLE 500 MG PO TABS: 500.0000 mg | ORAL_TABLET | Freq: Two times a day (BID) | ORAL | Status: DC

## 2013-08-02 NOTE — MAU Note (Signed)
Pt presents complaining of lower abdominal pain that started this am and got worse. States "it feels like menstrual cramps." Pt also states she has been unable to keep anything down today. Tried PO nausea medication but states it did not work. Pt also complains of "creamy white vaginal discharge" when she sneezes. Denies vaginal bleeding. Reports good fetal movement.

## 2013-08-02 NOTE — MAU Provider Note (Signed)
History     CSN: [id number]  Arrival date and time: 08/02/13 2036   None     Chief Complaint  Patient presents with  . Abdominal Pain  . Nausea   Abdominal Pain   Kristin Garner is a 23 y.o. G47P1001 female @ [redacted]w[redacted]d who presents w/ report of cramping beginning this am along w/ n/v. Has vomited x 4 today. Able to keep fluids down, but not solids. Tried zofran at home, but didn't  Help. Felt fine yesterday. Also w/ malodorous d/c x 1 wk. No itching/irritation. Reports good fm. Denies vb, lof, diarrhea, recent sick contacts.  States she initiated care at [facility] when she first learned of pregnancy, but hasn't returned since. She states she is filling out papers to begin care here at clinic, but hasn't yet scheduled appt.   OB History   Grav Para Term Preterm Abortions TAB SAB Ect Mult Living   2 1 1       1       Past Medical History  Diagnosis Date  . Gonorrhea   . Chlamydia     Past Surgical History  Procedure Laterality Date  . No past surgeries      History reviewed. No pertinent family history.  History  Substance Use Topics  . Smoking status: Former Smoker -- 0.25 packs/day for 1 years    Types: Cigarettes    Quit date: 02/10/2013  . Smokeless tobacco: Not on file  . Alcohol Use: Yes     Comment: last use Nov 2014    Allergies: No Known Allergies  Prescriptions prior to admission  Medication Sig Dispense Refill  . ondansetron (ZOFRAN ODT) 4 MG disintegrating tablet Take 1 tablet (4 mg total) by mouth every 8 (eight) hours as needed for nausea.  6 tablet  0  . Prenatal Vit-Fe Fumarate-FA (PRENATAL MULTIVITAMIN) TABS tablet Take 1 tablet by mouth daily at 12 noon.        Review of Systems  Gastrointestinal: Positive for abdominal pain.  Pertinent +/- as listed in HPI Physical Exam   Blood pressure 89/57, pulse 92, temperature 98.2 F (36.8 C), temperature source Oral, resp. rate 18, last menstrual period 03/19/2013.  Physical Exam  Constitutional: She is  oriented to person, place, and time. She appears well-developed and well-nourished.  HENT:  Head: Normocephalic.  Neck: Normal range of motion.  Cardiovascular: Normal rate.   Respiratory: Effort normal.  GI: Soft. There is no tenderness.  Gravid, fundus u-1  Genitourinary:  Spec exam: cx visually closed, mod amount thick white malodorous d/c SVE: LTC, posterior  Neurological: She is alert and oriented to person, place, and time.  Skin: Skin is warm and dry.  Psychiatric: She has a normal mood and affect. Her behavior is normal. Judgment and thought content normal.    MAU Course  Procedures  Po hydration Phenergan 25mg  IM FHR via doppler 152 Spec exam w/ wet prep and gc/ch SVE  2050: feels better after im phenergan, no vomiting since arrival, keeping fluids down. Cramping subsided.   Results for orders placed during the hospital encounter of 08/02/13 (from the past 24 hour(s))  URINALYSIS, ROUTINE W REFLEX MICROSCOPIC     Status: Abnormal   Collection Time    08/02/13  8:45 PM      Result Value Ref Range   Color, Urine YELLOW  YELLOW   APPearance CLEAR  CLEAR   Specific Gravity, Urine >1.030 (*) 1.005 - 1.030   pH 6.0  5.0 - 8.0  Glucose, UA NEGATIVE  NEGATIVE mg/dL   Hgb urine dipstick NEGATIVE  NEGATIVE   Bilirubin Urine NEGATIVE  NEGATIVE   Ketones, ur NEGATIVE  NEGATIVE mg/dL   Protein, ur NEGATIVE  NEGATIVE mg/dL   Urobilinogen, UA 0.2  0.0 - 1.0 mg/dL   Nitrite NEGATIVE  NEGATIVE   Leukocytes, UA NEGATIVE  NEGATIVE  WET PREP, GENITAL     Status: Abnormal   Collection Time    08/02/13  9:17 PM      Result Value Ref Range   Yeast Wet Prep HPF POC NONE SEEN  NONE SEEN   Trich, Wet Prep NONE SEEN  NONE SEEN   Clue Cells Wet Prep HPF POC FEW (*) NONE SEEN   WBC, Wet Prep HPF POC FEW (*) NONE SEEN     Assessment and Plan  A:   6019w0d SIUP  N/V  Cramping, resolved  BV  No/limited pnc P:   D/C home  Metronidazole 500mg  bid x 7d  Rx phenergan #30 w/ 0  RF  Encouraged po hydration  Reviewed ptl s/s, reasons to return  To call clinic on Monday to make new ob appt  Marge DuncansBooker, Daenerys Buttram Randall 08/02/2013, 9:19 PM

## 2013-08-02 NOTE — Discharge Instructions (Signed)
Drink enough water daily so that your urine is clear  Nausea & Vomiting  Have saltine crackers or pretzels by your bed and eat a few bites before you raise your head out of bed in the morning  Eat small frequent meals throughout the day instead of large meals  Drink plenty of fluids throughout the day to stay hydrated, just don't drink a lot of fluids with your meals.  This can make your stomach fill up faster making you feel sick  Do not brush your teeth right after you eat  Products with real ginger are good for nausea, like ginger ale and ginger hard candy Make sure it says made with real ginger!  Sucking on sour candy like lemon heads is also good for nausea  If your prenatal vitamins make you nauseated, take them at night so you will sleep through the nausea  If you feel like you need medicine for the nausea & vomiting please let us know  If you are unable to keep any fluids or food down please let us know   Preterm Labor Information Preterm labor is when labor starts at less than 37 weeks of pregnancy. The normal length of a pregnancy is 39 to 41 weeks. CAUSES Often, there is no identifiable underlying cause as to why a woman goes into preterm labor. One of the most common known causes of preterm labor is infection. Infections of the uterus, cervix, vagina, amniotic sac, bladder, kidney, or even the lungs (pneumonia) can cause labor to start. Other suspected causes of preterm labor include:   Urogenital infections, such as yeast infections and bacterial vaginosis.   Uterine abnormalities (uterine shape, uterine septum, fibroids, or bleeding from the placenta).   A cervix that has been operated on (it may fail to stay closed).   Malformations in the fetus.   Multiple gestations (twins, triplets, and so on).   Breakage of the amniotic sac.  RISK FACTORS  Having a previous history of preterm labor.   Having premature rupture of membranes (PROM).   Having a  placenta that covers the opening of the cervix (placenta previa).   Having a placenta that separates from the uterus (placental abruption).   Having a cervix that is too weak to hold the fetus in the uterus (incompetent cervix).   Having too much fluid in the amniotic sac (polyhydramnios).   Taking illegal drugs or smoking while pregnant.   Not gaining enough weight while pregnant.   Being younger than 47 and older than 23 years old.   Having a low socioeconomic status.   Being African American. SYMPTOMS Signs and symptoms of preterm labor include:   Menstrual-like cramps, abdominal pain, or back pain.  Uterine contractions that are regular, as frequent as six in an hour, regardless of their intensity (may be mild or painful).  Contractions that start on the top of the uterus and spread down to the lower abdomen and back.   A sense of increased pelvic pressure.   A watery or bloody mucus discharge that comes from the vagina.  TREATMENT Depending on the length of the pregnancy and other circumstances, your health care provider may suggest bed rest. If necessary, there are medicines that can be given to stop contractions and to mature the fetal lungs. If labor happens before 34 weeks of pregnancy, a prolonged hospital stay may be recommended. Treatment depends on the condition of both you and the fetus.  WHAT SHOULD YOU DO IF YOU THINK YOU ARE  IN PRETERM LABOR? Call your health care provider right away. You will need to go to the hospital to get checked immediately. HOW CAN YOU PREVENT PRETERM LABOR IN FUTURE PREGNANCIES? You should:   Stop smoking if you smoke.  Maintain healthy weight gain and avoid chemicals and drugs that are not necessary.  Be watchful for any type of infection.  Inform your health care provider if you have a known history of preterm labor. Document Released: 08/12/2003 Document Revised: 01/22/2013 Document Reviewed: 06/24/2012 [facility]  Patient Information 2014 [employer], [place].

## 2013-08-03 NOTE — MAU Provider Note (Signed)
`````  Attestation of Attending Supervision of Advanced Practitioner: Evaluation and management procedures were performed by the PA/NP/CNM/OB Fellow under my supervision/collaboration. Chart reviewed and agree with management and plan.  Vernel Donlan V 08/03/2013 1:56 AM    

## 2013-08-04 LAB — GC/CHLAMYDIA PROBE AMP
CT Probe RNA: NEGATIVE
GC PROBE AMP APTIMA: POSITIVE — AB

## 2013-08-05 ENCOUNTER — Encounter (HOSPITAL_COMMUNITY): Payer: Self-pay | Admitting: *Deleted

## 2013-09-01 ENCOUNTER — Encounter (HOSPITAL_COMMUNITY): Payer: Self-pay | Admitting: *Deleted

## 2013-09-01 ENCOUNTER — Inpatient Hospital Stay (HOSPITAL_COMMUNITY)
Admission: AD | Admit: 2013-09-01 | Discharge: 2013-09-01 | Disposition: A | Discharge status: Home / Self Care | Payer: Medicaid Other | Source: Ambulatory Visit | Attending: Obstetrics & Gynecology | Admitting: Obstetrics & Gynecology

## 2013-09-01 DIAGNOSIS — O212 Late vomiting of pregnancy: Secondary | ICD-10-CM | POA: Insufficient documentation

## 2013-09-01 DIAGNOSIS — R109 Unspecified abdominal pain: Secondary | ICD-10-CM | POA: Insufficient documentation

## 2013-09-01 DIAGNOSIS — A549 Gonococcal infection, unspecified: Secondary | ICD-10-CM

## 2013-09-01 DIAGNOSIS — N739 Female pelvic inflammatory disease, unspecified: Secondary | ICD-10-CM | POA: Insufficient documentation

## 2013-09-01 DIAGNOSIS — A5619 Other chlamydial genitourinary infection: Secondary | ICD-10-CM | POA: Insufficient documentation

## 2013-09-01 DIAGNOSIS — Z87891 Personal history of nicotine dependence: Secondary | ICD-10-CM | POA: Insufficient documentation

## 2013-09-01 DIAGNOSIS — A5400 Gonococcal infection of lower genitourinary tract, unspecified: Secondary | ICD-10-CM | POA: Insufficient documentation

## 2013-09-01 DIAGNOSIS — O98219 Gonorrhea complicating pregnancy, unspecified trimester: Secondary | ICD-10-CM | POA: Insufficient documentation

## 2013-09-01 DIAGNOSIS — O98319 Other infections with a predominantly sexual mode of transmission complicating pregnancy, unspecified trimester: Secondary | ICD-10-CM | POA: Insufficient documentation

## 2013-09-01 LAB — WET PREP, GENITAL
Trich, Wet Prep: NONE SEEN
YEAST WET PREP: NONE SEEN

## 2013-09-01 LAB — AMNISURE RUPTURE OF MEMBRANE (ROM) NOT AT ARMC: Amnisure ROM: NEGATIVE

## 2013-09-01 LAB — URINALYSIS, ROUTINE W REFLEX MICROSCOPIC
Bilirubin Urine: NEGATIVE
Glucose, UA: NEGATIVE mg/dL
Hgb urine dipstick: NEGATIVE
Ketones, ur: NEGATIVE mg/dL
LEUKOCYTES UA: NEGATIVE
Nitrite: NEGATIVE
PROTEIN: NEGATIVE mg/dL
Specific Gravity, Urine: 1.01 (ref 1.005–1.030)
UROBILINOGEN UA: 0.2 mg/dL (ref 0.0–1.0)
pH: 6 (ref 5.0–8.0)

## 2013-09-01 MED ORDER — CEFTRIAXONE SODIUM 250 MG IJ SOLR
250.0000 mg | Freq: Once | INTRAMUSCULAR | Status: AC
  Administered 2013-09-01: 250 mg via INTRAMUSCULAR
  Filled 2013-09-01: qty 250

## 2013-09-01 MED ORDER — AZITHROMYCIN 1 G PO PACK
1.0000 g | PACK | Freq: Once | ORAL | Status: AC
  Administered 2013-09-01: 1 g via ORAL
  Filled 2013-09-01: qty 1

## 2013-09-01 NOTE — MAU Provider Note (Signed)
None     Chief Complaint:  Rupture of Membranes, Emesis During Pregnancy and Abdominal Pain   Sui Kasparek is  23 y.o. G2P1001 at [redacted]w[redacted]d presents complaining of Rupture of Membranes, Emesis During Pregnancy and Abdominal Pain  Pt reports leaking fluid clear all day. No gush. Pt has used panty liners all day reports soaked. No bleeding. Last time pt felt fetus move was last night. Having middle abdomen and back pain once an hour.   Obstetrical/Gynecological History: OB History   Grav Para Term Preterm Abortions TAB SAB Ect Mult Living   2 1 1       1      Past Medical History: Past Medical History  Diagnosis Date  . Gonorrhea   . Chlamydia     Past Surgical History: Past Surgical History  Procedure Laterality Date  . No past surgeries      Family History: Family History  Problem Relation Age of Onset  . Cancer Mother     Lupus    Social History: History  Substance Use Topics  . Smoking status: Former Smoker -- 0.25 packs/day for 1 years    Types: Cigarettes    Quit date: 02/10/2013  . Smokeless tobacco: Not on file  . Alcohol Use: Yes     Comment: last use Nov 2014    Allergies: No Known Allergies  Meds:  Prescriptions prior to admission  Medication Sig Dispense Refill  . metroNIDAZOLE (FLAGYL) 500 MG tablet Take 1 tablet (500 mg total) by mouth 2 (two) times daily. X 7 days  14 tablet  0  . ondansetron (ZOFRAN ODT) 4 MG disintegrating tablet Take 1 tablet (4 mg total) by mouth every 8 (eight) hours as needed for nausea.  6 tablet  0  . Prenatal Vit-Fe Fumarate-FA (PRENATAL MULTIVITAMIN) TABS tablet Take 1 tablet by mouth daily at 12 noon.        Review of Systems -   Review of Systems  No f/c, no HA, endorses dry heaves/no nausea, no diarrhea constipaion, no SOB/CP, no vision changes.   Physical Exam  Blood pressure 114/58, pulse 89, temperature 98.8 F (37.1 C), resp. rate 16, height 5\' 4"  (1.626 m), last menstrual period 03/19/2013, SpO2  100.00%. GENERAL: Well-developed, well-nourished female in no acute distress.  LUNGS: Clear to auscultation bilaterally.  HEART: Regular rate and rhythm. ABDOMEN: Soft, nontender, nondistended, gravid.  EXTREMITIES: Nontender, no edema, 2+ distal pulses. SSE: copious white discharge, no pooling, no blood DTR's 2+ C/t/h Exam by:: Glorya Bartley MD   FHT:  Baseline rate 150s bpm   Variability moderate  Accelerations 10x10   Decelerations none Contractions: None seen on monitor  Labs: Results for orders placed during the hospital encounter of 09/01/13 (from the past 24 hour(s))  URINALYSIS, ROUTINE W REFLEX MICROSCOPIC   Collection Time    09/01/13  8:48 PM      Result Value Ref Range   Color, Urine YELLOW  YELLOW   APPearance CLEAR  CLEAR   Specific Gravity, Urine 1.010  1.005 - 1.030   pH 6.0  5.0 - 8.0   Glucose, UA NEGATIVE  NEGATIVE mg/dL   Hgb urine dipstick NEGATIVE  NEGATIVE   Bilirubin Urine NEGATIVE  NEGATIVE   Ketones, ur NEGATIVE  NEGATIVE mg/dL   Protein, ur NEGATIVE  NEGATIVE mg/dL   Urobilinogen, UA 0.2  0.0 - 1.0 mg/dL   Nitrite NEGATIVE  NEGATIVE   Leukocytes, UA NEGATIVE  NEGATIVE  WET PREP, GENITAL   Collection Time  09/01/13  9:36 PM      Result Value Ref Range   Yeast Wet Prep HPF POC NONE SEEN  NONE SEEN   Trich, Wet Prep NONE SEEN  NONE SEEN   Clue Cells Wet Prep HPF POC FEW (*) NONE SEEN   WBC, Wet Prep HPF POC FEW (*) NONE SEEN  AMNISURE RUPTURE OF MEMBRANE (ROM)   Collection Time    09/01/13  9:36 PM      Result Value Ref Range   Amnisure ROM NEGATIVE     Imaging Studies:  No results found.  Assessment: Beryle LatheFelicia Gabriel is  23 y.o. G2P1001 at 3168w2d presents with leakage of fluid. On exam copious white discharge. Tx for gonorhea and chlamydia. Neg amnisure. Will discharge home with return precautions.  FWB: appears to have reactive strip. No further intervention at this time. Reassurance for variability in fetal movement in early 20 wks. STI: Tx  gonorhea and chlamdia presumptively  Adonai Helzer RYAN 3/30/201510:20 PM

## 2013-09-01 NOTE — MAU Note (Signed)
Patient states she has been leaking a lot of fluid all day long. She started using a panty liner and soaked 4 panty liner and then soaked 4 regular sanitary napkins. No leaking currently and not wearing a pad on arrival. Patient also complains of vomiting clear liquid twice today but has been able to eat normally. Lastly she has been feeling lower abdominal and back pressure since 3 this afternoon.

## 2013-09-01 NOTE — Discharge Instructions (Signed)
Gonorrhea Gonorrhea is an infection that can cause serious problems. If left untreated, may   Damage the female or female organs.   Cause women to be unable to have children (sterility).   Harm a fetus, if the infected woman is pregnant.  It is important to get treatment for gonorrhea as soon as possible. It is also necessary that all your sexual partners be tested for the infection.  CAUSES  Gonorrhea is caused by bacteria called Neisseria gonorrhoeae. The infection is spread from person to person, usually by sexual contact (such as by anal, vaginal, or oral means). A newborn can contract the infection from his or her mother during birth.  SYMPTOMS  Some people with gonorrhea do not have symptoms. Symptoms may be different in females and males.  Females The most common symptoms are:   Pain in the lower abdomen.   Fever with or without chills.  Other symptoms include:   Abnormal vaginal discharge.   Painful intercourse.   Burning or itching of the vagina or lips of the vagina.   Abnormal vaginal bleeding.   Pain when urinating.   Long-lasting (chronic) pain in the lower abdomen, especially during menstruation or intercourse.   Inability to become pregnant.   Going into premature labor.   Irritation, pain, bleeding, or discharge from the rectum. This may occur if the infection was spread by anal sex.   Sore throat or swollen neck lymph nodes. This may occur if the infection was spread by oral sex.  Males The most common symptoms are:   Discharge from the penis.   Pain or burning during urination.   Pain or swelling in the testicles. Other symptoms may include:   Irritation, pain, bleeding, or discharge from the rectum. This may occur if the infection was spread by anal sex.   Sore throat, fever, or swollen neck lymph nodes. This may occur if the infection was spread by oral sex.  DIAGNOSIS  A diagnosis is made after a physical exam is done and a  sample of discharge is examined under a microscope for the presence of the bacteria. The discharge may be taken from the urethra, cervix, throat, or rectum.  TREATMENT  Gonorrhea is treated with antibiotic medicines. It is important for treatment to begin as soon as possible. Early treatment may prevent some problems from developing.  HOME CARE INSTRUCTIONS   Only take over-the-counter or prescription medicines for pain, fever, or discomfort as directed by your health care provider.   Take antibiotics as directed. Make sure you finish them even if you start to feel better. Incomplete treatment will put you at risk for continued infection.   Do not have sex until treatment is complete or as directed by your health care provider.   Follow up with your health care provider as directed.   Not all test results are available during your visit. If your test results are not back during the visit, make an appointment with your health care provider to find out the results. Do not assume everything is normal if you have not heard from your health care provider or the medical facility. It is important for you to follow up on all of your test results.   If you test positive for gonorrhea, inform your recent sexual partners. They need to be checked for gonorrhea even if they do not have symptoms. They may need treatment, even if they test negative for gonorrhea.  SEEK MEDICAL CARE IF:   You develop any bad  reaction to the medicine you were prescribed. This may include:   °· A rash.   °· Nausea.   °· Vomiting.   °· Diarrhea.   °· Your symptoms do not improve after a few days of taking antibiotics.   °· Your symptoms get worse.   °· You develop increased pain, such as in the testicles (for males) or in the abdomen (for females).   °SEEK IMMEDIATE MEDICAL CARE IF: °· You have a fever or persistent symptoms for more than 2 3 days.   °· You have a fever and your symptoms suddenly get worse.   °MAKE SURE YOU:    °· Understand these instructions. °· Will watch your condition. °· Will get help right away if you are not doing well or get worse. °Document Released: 05/19/2000 Document Revised: 03/12/2013 Document Reviewed: 11/27/2012 °Exit[employer]® Patient Information ©2014 [employer]. ° °

## 2013-09-05 ENCOUNTER — Inpatient Hospital Stay (HOSPITAL_COMMUNITY)
Admission: AD | Admit: 2013-09-05 | Discharge: 2013-09-06 | Disposition: A | Discharge status: Home / Self Care | Payer: Medicaid Other | Source: Ambulatory Visit | Attending: Obstetrics and Gynecology | Admitting: Obstetrics and Gynecology

## 2013-09-05 ENCOUNTER — Encounter (HOSPITAL_COMMUNITY): Payer: Self-pay | Admitting: *Deleted

## 2013-09-05 DIAGNOSIS — O99891 Other specified diseases and conditions complicating pregnancy: Secondary | ICD-10-CM | POA: Insufficient documentation

## 2013-09-05 DIAGNOSIS — Z87891 Personal history of nicotine dependence: Secondary | ICD-10-CM | POA: Insufficient documentation

## 2013-09-05 DIAGNOSIS — Y929 Unspecified place or not applicable: Secondary | ICD-10-CM | POA: Insufficient documentation

## 2013-09-05 DIAGNOSIS — R109 Unspecified abdominal pain: Secondary | ICD-10-CM | POA: Insufficient documentation

## 2013-09-05 DIAGNOSIS — W1809XA Striking against other object with subsequent fall, initial encounter: Secondary | ICD-10-CM | POA: Insufficient documentation

## 2013-09-05 DIAGNOSIS — O9989 Other specified diseases and conditions complicating pregnancy, childbirth and the puerperium: Principal | ICD-10-CM
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
## 2013-09-05 NOTE — MAU Note (Signed)
Pt reports she got bumped into and fell forward on to her belly on her kitchen floor. Reprotsw her stomach is sore and feel hard. Fetal movement feels less than usual since fall. Pt stated she fell about an hour ago.

## 2013-09-06 NOTE — Discharge Instructions (Signed)
You and the baby are doing well. You were monitored for ~ 4 hours with no contractions and good fetal reactivity.  Please be sure to continue to take your Prenatal and [facility].

## 2013-09-12 NOTE — MAU Provider Note (Signed)
Attestation of Attending Supervision of Advanced Practitioner: Evaluation and management procedures were performed by the PA/NP/CNM/OB Fellow under my supervision/collaboration. Chart reviewed and agree with management and plan.  Tilda BurrowJohn V Viveka Wilmeth 09/12/2013 7:37 AM

## 2013-09-25 ENCOUNTER — Other Ambulatory Visit (HOSPITAL_COMMUNITY): Payer: Self-pay | Admitting: Family

## 2013-09-25 DIAGNOSIS — Z3689 Encounter for other specified antenatal screening: Secondary | ICD-10-CM

## 2013-10-02 ENCOUNTER — Other Ambulatory Visit (HOSPITAL_COMMUNITY): Payer: Self-pay | Admitting: Family

## 2013-10-02 ENCOUNTER — Inpatient Hospital Stay (HOSPITAL_COMMUNITY)
Admission: AD | Admit: 2013-10-02 | Discharge: 2013-10-03 | DRG: 782 | Disposition: A | Discharge status: Home / Self Care | Payer: Medicaid Other | Source: Ambulatory Visit | Attending: Obstetrics and Gynecology | Admitting: Obstetrics and Gynecology

## 2013-10-02 ENCOUNTER — Ambulatory Visit (HOSPITAL_COMMUNITY)
Admission: RE | Admit: 2013-10-02 | Discharge: 2013-10-02 | Disposition: A | Discharge status: Home / Self Care | Payer: Medicaid Other | Source: Ambulatory Visit | Attending: Family | Admitting: Family

## 2013-10-02 ENCOUNTER — Encounter (HOSPITAL_COMMUNITY): Payer: Self-pay | Admitting: *Deleted

## 2013-10-02 VITALS — BP 117/67 | HR 79 | Wt 169.5 lb

## 2013-10-02 DIAGNOSIS — Z87891 Personal history of nicotine dependence: Secondary | ICD-10-CM

## 2013-10-02 DIAGNOSIS — O26879 Cervical shortening, unspecified trimester: Secondary | ICD-10-CM

## 2013-10-02 DIAGNOSIS — Z3689 Encounter for other specified antenatal screening: Secondary | ICD-10-CM

## 2013-10-02 DIAGNOSIS — N883 Incompetence of cervix uteri: Secondary | ICD-10-CM

## 2013-10-02 DIAGNOSIS — O4700 False labor before 37 completed weeks of gestation, unspecified trimester: Secondary | ICD-10-CM | POA: Diagnosis present

## 2013-10-02 DIAGNOSIS — O479 False labor, unspecified: Secondary | ICD-10-CM

## 2013-10-02 LAB — URINALYSIS, ROUTINE W REFLEX MICROSCOPIC
Bilirubin Urine: NEGATIVE
Glucose, UA: NEGATIVE mg/dL
Hgb urine dipstick: NEGATIVE
Ketones, ur: NEGATIVE mg/dL
Nitrite: NEGATIVE
Protein, ur: NEGATIVE mg/dL
SPECIFIC GRAVITY, URINE: 1.01 (ref 1.005–1.030)
UROBILINOGEN UA: 0.2 mg/dL (ref 0.0–1.0)
pH: 7 (ref 5.0–8.0)

## 2013-10-02 LAB — URINE MICROSCOPIC-ADD ON

## 2013-10-02 MED ORDER — BETAMETHASONE SOD PHOS & ACET 6 (3-3) MG/ML IJ SUSP: 12.5000 mg | Freq: Once | INTRAMUSCULAR | Status: DC

## 2013-10-02 MED ORDER — ZOLPIDEM TARTRATE 5 MG PO TABS
5.0000 mg | ORAL_TABLET | Freq: Every evening | ORAL | Status: DC | PRN
  Administered 2013-10-02: 5 mg via ORAL
  Filled 2013-10-02: qty 1

## 2013-10-02 MED ORDER — DOCUSATE SODIUM 100 MG PO CAPS
100.0000 mg | ORAL_CAPSULE | Freq: Every day | ORAL | Status: DC
  Administered 2013-10-03: 100 mg via ORAL
  Filled 2013-10-02: qty 1

## 2013-10-02 MED ORDER — BETAMETHASONE SOD PHOS & ACET 6 (3-3) MG/ML IJ SUSP
12.0000 mg | INTRAMUSCULAR | Status: AC
  Administered 2013-10-02 – 2013-10-03 (×2): 12 mg via INTRAMUSCULAR
  Filled 2013-10-02 (×2): qty 2

## 2013-10-02 MED ORDER — ACETAMINOPHEN 325 MG PO TABS: 650.0000 mg | ORAL_TABLET | ORAL | Status: DC | PRN

## 2013-10-02 MED ORDER — LACTATED RINGERS IV SOLN
INTRAVENOUS | Status: DC
  Administered 2013-10-02 – 2013-10-03 (×5): via INTRAVENOUS

## 2013-10-02 MED ORDER — PRENATAL MULTIVITAMIN CH
1.0000 | ORAL_TABLET | Freq: Every day | ORAL | Status: DC
  Administered 2013-10-03: 1 via ORAL
  Filled 2013-10-02: qty 1

## 2013-10-02 MED ORDER — NIFEDIPINE 10 MG PO CAPS
20.0000 mg | ORAL_CAPSULE | Freq: Once | ORAL | Status: AC
  Administered 2013-10-02: 20 mg via ORAL
  Filled 2013-10-02: qty 2

## 2013-10-02 MED ORDER — NIFEDIPINE 10 MG PO CAPS
10.0000 mg | ORAL_CAPSULE | Freq: Four times a day (QID) | ORAL | Status: DC
  Administered 2013-10-02 – 2013-10-03 (×4): 10 mg via ORAL
  Filled 2013-10-02 (×4): qty 1

## 2013-10-02 MED ORDER — CALCIUM CARBONATE ANTACID 500 MG PO CHEW: 2.0000 | CHEWABLE_TABLET | ORAL | Status: DC | PRN

## 2013-10-02 NOTE — MAU Note (Addendum)
Was seen in [facility]. Brought to MAU for further evaluation due to shortened cervix per U/S. Patient states she was examined last week and was told her cervix was closed and long. States she feels occasional pressure and cramping. on

## 2013-10-02 NOTE — Plan of Care (Signed)
Problem: Consults Goal: Neonatologist Consult Outcome: Not Applicable Date Met:  [birth year] CNM assured that possibility of delivering is remote.

## 2013-10-02 NOTE — H&P (Signed)
Attestation of Attending Supervision of Advanced Practitioner (CNM/NP): Evaluation and management procedures were performed by the Advanced Practitioner under my supervision and collaboration. I have reviewed the Advanced Practitioner's note and chart, and I agree with the management and plan.  Naylin Burkle H Elnathan Fulford 6:47 PM   

## 2013-10-02 NOTE — Consult Note (Signed)
MFM consult  23 yr old G2P1001 at 8661w5d for fetal anatomic survey and consult.  Past OB hx: full term SVD  Ultrasound today shows: single intrauterine pregnancy. Estimated fetal weight is in the 50th%. Anterior placenta without evidence of previa. Normal amniotic fluid volume. Shortened tranvaginal cervical length of 2cm. The views of the intracranial anatomy, ductal arch, face, abdominal wall, hands, and ankles are limited. The remainder of the limited anatomy survey is normal.  I counseled the patient as follows:  1. Appropriate fetal growth. 2. Limited anatomy survey: - recommend follow up ultrasound in 2-4 weeks to complete anatomic survey 3. Short cervix: - discussed increased risk of preterm labor, PPROM, and preterm delivery - recommend preterm labor precautions - patient reporting vaginal pressure and cramping- recommend she be evaluated in MAU. If no contractions and no cervical change recommend course of betamethasone as an outpatient; if contractions and/or cervical change recommend inpatient management at least through steroid window - given late gestational age do not feel vaginal progesterone would be of help - if admitted recommend NICU consult - after betamethasone recommend follow cervcial exams clinically as symptoms dictate  Discussed with Dr. Penne LashLeggett and patient sent to MAU  I spent a total of 20 minutes with the patient of which >50% was in face to face consultation.  Please call with questions.  Eulis FosterKristen Mairyn Lenahan, MD

## 2013-10-02 NOTE — H&P (Signed)
Kristin LatheFelicia Garner is a 23 y.o. G2P1001 at 2354w5d admitted for shortened cx, preterm contractions: R/O PTL  Fetal presentation is cephalic.  History of Present Illness: G2P1001 was seen today by MFM for a routine U/S and cx was noted to be shortened at 2.0 cm.  Evaluation in MAU reveals mild ctx (felt as "pressure") q 2-3 mintues, reassuring FHT.  Pt noticed the pressure intermittently over the past 2 days, but much more frequent since her u/s today.      Past Medical History: Past Medical History  Diagnosis Date  . Gonorrhea   . Chlamydia     Past Surgical History: Past Surgical History  Procedure Laterality Date  . No past surgeries      Obstetrical History: OB History   Grav Para Term Preterm Abortions TAB SAB Ect Mult Living   2 1 1       1        Social History: History   Social History  . Marital Status: Single    Spouse Name: N/A    Number of Children: N/A  . Years of Education: N/A   Social History Main Topics  . Smoking status: Former Smoker -- 0.25 packs/day for 1 years    Types: Cigarettes    Quit date: 02/10/2013  . Smokeless tobacco: None  . Alcohol Use: Yes     Comment: last use Nov 2014  . Drug Use: Yes    Special: Marijuana     Comment: last use Oct 2014  . Sexual Activity: Yes     Comment: Last sex in October   Other Topics Concern  . None   Social History Narrative  . None    Family History: Family History  Problem Relation Age of Onset  . Cancer Mother     Lupus    Allergies: No Known Allergies  Prescriptions prior to admission  Medication Sig Dispense Refill  . acetaminophen (TYLENOL) 325 MG tablet Take 650 mg by mouth every 6 (six) hours as needed for moderate pain.      . Prenatal Vit-Fe Fumarate-FA (PRENATAL MULTIVITAMIN) TABS tablet Take 1 tablet by mouth daily at 12 noon.       Review of Systems   Constitutional: Negative for fever and chills Eyes: Negative for visual disturbances Respiratory: Negative for shortness  of breath, dyspnea Cardiovascular: Negative for chest pain or palpitations  Gastrointestinal: Negative for vomiting, diarrhea and constipation Genitourinary: Negative for dysuria and urgency Musculoskeletal: Negative for back pain, joint pain, myalgias  Neurological: Negative for dizziness and headaches    Vitals:  Blood pressure 120/61, pulse 86, temperature 98.1 F (36.7 C), temperature source Oral, resp. rate 18, last menstrual period 03/19/2013. Physical Examination:  General appearance - alert, well appearing, and in no distress Chest - clear to auscultation, no wheezes, rales or rhonchi, symmetric air entry Heart - normal rate and regular rhythm Abdomen - soft, nontender, nondistended, no masses or organomegaly Pelvic - Digital cx exam:  Closed/50%/breech Extremities - no pedal edema noted Skin - normal coloration and turgor, no rashes, no suspicious skin lesions noted Membranes:intact Fetal Monitoring:Baseline: 150 bpm, Variability: Good {> 6 bpm), Accelerations: Non-reactive but appropriate for gestational age and Decelerations: Absent   Labs:  No results found for this or any previous visit (from the past 24 hour(s)).   . docusate sodium  100 mg Oral Daily  . [START ON 10/03/2013] NIFEdipine  10 mg Oral 4 times per day  . [START ON 10/03/2013] prenatal multivitamin  1  tablet Oral Q1200     ASSESSMENT:  Short cx Preterm contractions R/O preterm labor  PLAN:  Procardia BMZ GBS culture U/A Gc/Chl Repeat cx exam in a few hours; if changing, will start MgSO4 for CP prophylaxis

## 2013-10-03 DIAGNOSIS — O4700 False labor before 37 completed weeks of gestation, unspecified trimester: Secondary | ICD-10-CM | POA: Diagnosis present

## 2013-10-03 MED ORDER — NIFEDIPINE ER OSMOTIC RELEASE 30 MG PO TB24: 30.0000 mg | ORAL_TABLET | Freq: Every day | ORAL | Status: DC

## 2013-10-03 NOTE — Discharge Instructions (Signed)
Preterm Labor Information °Preterm labor is when labor starts before you are [redacted] weeks pregnant. The normal length of pregnancy is 39 to 41 weeks.  °CAUSES  °The cause of preterm labor is not often known. The most common known cause is infection. °RISK FACTORS °· Having a history of preterm labor. °· Having your water break before it should. °· Having a placenta that covers the opening of the cervix. °· Having a placenta that breaks away from the uterus. °· Having a cervix that is too weak to hold the baby in the uterus. °· Having too much fluid in the amniotic sac. °· Taking drugs or smoking while pregnant. °· Not gaining enough weight while pregnant. °· Being younger than 18 and older than 23 years old. °· Having a low income. °· Being African American. °SYMPTOMS °· Period-like cramps, belly (abdominal) pain, or back pain. °· Contractions that are regular, as often as six in an hour. They may be mild or painful. °· Contractions that start at the top of the belly. They then move to the lower belly and back. °· Lower belly pressure that seems to get stronger. °· Bleeding from the vagina. °· Fluid leaking from the vagina. °TREATMENT  °Treatment depends on: °· Your condition. °· The condition of your baby. °· How many weeks pregnant you are. °Your doctor may have you: °· Take medicine to stop contractions. °· Stay in bed except to use the restroom (bed rest). °· Stay in the hospital. °WHAT SHOULD YOU DO IF YOU THINK YOU ARE IN PRETERM LABOR? °Call your doctor right away. You need to go to the hospital right away.  °HOW CAN YOU PREVENT PRETERM LABOR IN FUTURE PREGNANCIES? °· Stop smoking, if you smoke. °· Maintain healthy weight gain. °· Do not take drugs or be around chemicals that are not needed. °· Tell your doctor if you think you have an infection. °· Tell your doctor if you had a preterm labor before. °Document Released: 08/18/2008 Document Revised: 03/12/2013 Document Reviewed: 08/18/2008 °Exit[employer]® Patient  Information ©2014 [employer]. ° °

## 2013-10-03 NOTE — Progress Notes (Signed)
FACULTY PRACTICE ANTEPARTUM(COMPREHENSIVE) NOTE  Kristin Garner is a 23 y.o. G2P1001 at 637w6d by early ultrasound who is admitted for short cervix/r/o PTL.   Fetal presentation is cephalic. Length of Stay:  1  Days  Subjective: Not feeling any more contractions Patient reports the fetal movement as active. Patient reports uterine contraction  activity as none. Patient reports  vaginal bleeding as none. Patient describes fluid per vagina as None.  Vitals:  Blood pressure 116/53, pulse 100, temperature 98 F (36.7 C), temperature source Oral, resp. rate 18, height 5\' 4"  (1.626 m), weight 76.885 kg (169 lb 8 oz), last menstrual period 03/19/2013. Physical Examination:  General appearance - alert, well appearing, and in no distress Heart - normal rate and regular rhythm Abdomen - soft, nontender, nondistended Fundal Height:  size equals dates Cervical Exam: Not evaluated.  fetal presentation is cephalic. Extremities: extremities normal, atraumatic, no cyanosis or edema and Homans sign is negative, no sign of DVT with DTRs 2+ bilaterally Membranes:intact  Fetal Monitoring:  NST reactive; no uterine activity    Labs:  Results for orders placed during the hospital encounter of 10/02/13 (from the past 24 hour(s))  URINALYSIS, ROUTINE W REFLEX MICROSCOPIC   Collection Time    10/02/13  4:25 PM      Result Value Ref Range   Color, Urine YELLOW  YELLOW   APPearance CLEAR  CLEAR   Specific Gravity, Urine 1.010  1.005 - 1.030   pH 7.0  5.0 - 8.0   Glucose, UA NEGATIVE  NEGATIVE mg/dL   Hgb urine dipstick NEGATIVE  NEGATIVE   Bilirubin Urine NEGATIVE  NEGATIVE   Ketones, ur NEGATIVE  NEGATIVE mg/dL   Protein, ur NEGATIVE  NEGATIVE mg/dL   Urobilinogen, UA 0.2  0.0 - 1.0 mg/dL   Nitrite NEGATIVE  NEGATIVE   Leukocytes, UA SMALL (*) NEGATIVE  URINE MICROSCOPIC-ADD ON   Collection Time    10/02/13  4:25 PM      Result Value Ref Range   Squamous Epithelial / LPF MANY (*) RARE   WBC,  UA 3-6  <3 WBC/hpf   RBC / HPF 0-2  <3 RBC/hpf   Bacteria, UA MANY (*) RARE    Imaging Studies:     Currently EPIC will not allow sonographic studies to automatically populate into notes.  In the meantime, copy and paste results into note or free text.  Medications:  Scheduled . betamethasone acetate-betamethasone sodium phosphate  12 mg Intramuscular Q24 Hr x 2  . docusate sodium  100 mg Oral Daily  . NIFEdipine  10 mg Oral 4 times per day  . prenatal multivitamin  1 tablet Oral Q1200   ASSESSMENT: Short cervix Preterm labor unlikely at this time  PLAN: 2nd BMZ due around 1700; if repeat cx exam is unchanged, pt may go home  Kristin Garner 10/03/2013,8:15 AM

## 2013-10-03 NOTE — Progress Notes (Signed)
Pt ambulated off unit for discharge with no further concerns. 

## 2013-10-03 NOTE — Discharge Summary (Signed)
Physician Discharge Summary  Patient ID: Kristin LatheFelicia Garner MRN: [id number] DOB/AGE: 01/16/1991 22 y.o.  Admit date: 10/02/2013 Discharge date: 10/03/2013  Admission Diagnoses: Preterm contractions  Discharge Diagnoses: same Active Problems:   * No active hospital problems. *   Discharged Condition: good  Hospital Course: Patient admitted secondary to preterm contractions in the presence of a short cervix. Patient was admitted to receive a course of betamethasone and tocolysis with procardia. Throughout her admission, the patient and fetus remained hemodynamically stable, the contractions stopped and her cervix remained unchanged. She was deemed stable for discharge home and to continue tocolysis with procardia.  Consults: None  Treatments: IV hydration and procardia and betamethasone  Discharge Exam: Blood pressure 97/40, pulse 97, temperature 98.7 F (37.1 C), temperature source Oral, resp. rate 18, height 5\' 4"  (1.626 m), weight 169 lb 8 oz (76.885 kg), last menstrual period 03/19/2013. General appearance: alert, cooperative and no distress Resp: clear to auscultation bilaterally Cardio: regular rate and rhythm, S1, S2 normal, no murmur, click, rub or gallop Extremities: extremities normal, atraumatic, no cyanosis or edema cervix: closed and thick  Disposition: 01-Home or Self Care     Medication List         acetaminophen 325 MG tablet  Commonly known as:  TYLENOL  Take 650 mg by mouth every 6 (six) hours as needed for moderate pain.     NIFEdipine 30 MG 24 hr tablet  Commonly known as:  PROCARDIA-XL/ADALAT-CC/NIFEDICAL-XL  Take 1 tablet (30 mg total) by mouth daily.     prenatal multivitamin Tabs tablet  Take 1 tablet by mouth daily at 12 noon.       Follow-up Information   Please follow up. ([facility]       Signed: Dominiqua Cooner 10/03/2013, 9:01 PM

## 2013-10-03 NOTE — Progress Notes (Signed)
Discharge instructions given to pt. Pt verbalized understanding

## 2013-10-03 NOTE — Progress Notes (Signed)
UR completed 

## 2013-10-06 LAB — CULTURE, BETA STREP (GROUP B ONLY)

## 2013-10-08 ENCOUNTER — Other Ambulatory Visit (HOSPITAL_COMMUNITY): Payer: Self-pay | Admitting: Obstetrics and Gynecology

## 2013-10-08 ENCOUNTER — Other Ambulatory Visit (HOSPITAL_COMMUNITY): Payer: Self-pay | Admitting: Maternal and Fetal Medicine

## 2013-10-08 DIAGNOSIS — O26879 Cervical shortening, unspecified trimester: Secondary | ICD-10-CM

## 2013-10-23 ENCOUNTER — Ambulatory Visit (HOSPITAL_COMMUNITY): Payer: Medicaid Other

## 2013-10-27 ENCOUNTER — Inpatient Hospital Stay (HOSPITAL_COMMUNITY)
Admission: AD | Admit: 2013-10-27 | Discharge: 2013-10-27 | Disposition: A | Discharge status: Home / Self Care | Payer: Medicaid Other | Source: Ambulatory Visit | Attending: Obstetrics & Gynecology | Admitting: Obstetrics & Gynecology

## 2013-10-27 ENCOUNTER — Encounter (HOSPITAL_COMMUNITY): Payer: Self-pay | Admitting: *Deleted

## 2013-10-27 DIAGNOSIS — O4700 False labor before 37 completed weeks of gestation, unspecified trimester: Secondary | ICD-10-CM

## 2013-10-27 DIAGNOSIS — Z87891 Personal history of nicotine dependence: Secondary | ICD-10-CM

## 2013-10-27 DIAGNOSIS — O26879 Cervical shortening, unspecified trimester: Secondary | ICD-10-CM

## 2013-10-27 LAB — URINALYSIS, ROUTINE W REFLEX MICROSCOPIC
Bilirubin Urine: NEGATIVE
Glucose, UA: NEGATIVE mg/dL
Hgb urine dipstick: NEGATIVE
KETONES UR: NEGATIVE mg/dL
LEUKOCYTES UA: NEGATIVE
NITRITE: NEGATIVE
Protein, ur: NEGATIVE mg/dL
SPECIFIC GRAVITY, URINE: 1.015 (ref 1.005–1.030)
Urobilinogen, UA: 0.2 mg/dL (ref 0.0–1.0)
pH: 7 (ref 5.0–8.0)

## 2013-10-27 NOTE — MAU Provider Note (Signed)
Attestation of Attending Supervision of Advanced Practitioner (CNM/NP): Evaluation and management procedures were performed by the Advanced Practitioner under my supervision and collaboration.  I have reviewed the Advanced Practitioner's note and chart, and I agree with the management and plan.  Sennie Borden Harraway-Smith 7:07 AM     

## 2013-10-27 NOTE — MAU Note (Signed)
Pt reports contractions since 3 am. States she was admitted for preterm labor about 3 weeks ago.

## 2013-10-27 NOTE — MAU Provider Note (Signed)
History     CSN: [id number]  Arrival date and time: 10/27/13 0609   First Provider Initiated Contact with Patient 10/27/13 0631      Chief Complaint  Patient presents with  . Contractions   HPI  Pt is a 23 yo G2P1001 at [redacted]w[redacted]d wks IUP here with report of contractions that started at 0300 occuring every 5 minutes.  No bleeding or leaking of fluid.  [facility]  Admitted at 29 wks for shortened cervix and given BMZ and IV magnesium sulfate.  Discharged with closed cervix.  Hx of gonorrhea during pregnancy, treated in Feb 2015.  Reports partner was also treated.    Past Medical History  Diagnosis Date  . Gonorrhea   . Chlamydia     Past Surgical History  Procedure Laterality Date  . No past surgeries      Family History  Problem Relation Age of Onset  . Cancer Mother     Lupus    History  Substance Use Topics  . Smoking status: Former Smoker -- 0.25 packs/day for 1 years    Types: Cigarettes    Quit date: 02/10/2013  . Smokeless tobacco: Not on file  . Alcohol Use: Yes     Comment: last use Nov 2014    Allergies: No Known Allergies  Prescriptions prior to admission  Medication Sig Dispense Refill  . acetaminophen (TYLENOL) 325 MG tablet Take 650 mg by mouth every 6 (six) hours as needed for moderate pain.      Marland Kitchen NIFEdipine (PROCARDIA-XL/ADALAT-CC/NIFEDICAL-XL) 30 MG 24 hr tablet Take 1 tablet (30 mg total) by mouth daily.  30 tablet  1  . Prenatal Vit-Fe Fumarate-FA (PRENATAL MULTIVITAMIN) TABS tablet Take 1 tablet by mouth daily at 12 noon.        Review of Systems  Gastrointestinal: Positive for abdominal pain.  All other systems reviewed and are negative.  Physical Exam   Blood pressure 127/69, pulse 89, temperature 98.4 F (36.9 C), temperature source Oral, resp. rate 18, height 5\' 5"  (1.651 m), weight 77.565 kg (171 lb), last menstrual period 03/19/2013, SpO2 100.00%.  Physical Exam  Constitutional: She is oriented  to person, place, and time. She appears well-developed and well-nourished.  HENT:  Head: Normocephalic.  Neck: Normal range of motion. Neck supple.  Cardiovascular: Normal rate, regular rhythm and normal heart sounds.   Respiratory: Effort normal and breath sounds normal.  GI: Soft. There is no tenderness.  Genitourinary: No bleeding around the vagina. Vaginal discharge (mucusy) found.  Neurological: She is alert and oriented to person, place, and time.  Skin: Skin is warm and dry.   Cervix - closed FHR 130's, +accels Toco - irritability MAU Course  Procedures  Results for orders placed during the hospital encounter of 10/27/13 (from the past 24 hour(s))  URINALYSIS, ROUTINE W REFLEX MICROSCOPIC     Status: None   Collection Time    10/27/13  6:18 AM      Result Value Ref Range   Color, Urine YELLOW  YELLOW   APPearance CLEAR  CLEAR   Specific Gravity, Urine 1.015  1.005 - 1.030   pH 7.0  5.0 - 8.0   Glucose, UA NEGATIVE  NEGATIVE mg/dL   Hgb urine dipstick NEGATIVE  NEGATIVE   Bilirubin Urine NEGATIVE  NEGATIVE   Ketones, ur NEGATIVE  NEGATIVE mg/dL   Protein, ur NEGATIVE  NEGATIVE mg/dL   Urobilinogen, UA 0.2  0.0 - 1.0 mg/dL   Nitrite  NEGATIVE  NEGATIVE   Leukocytes, UA NEGATIVE  NEGATIVE    Assessment and Plan  23 yo G2P1001 at 9476w2d wks IUP Braxton Hicks Short Cervix - stable  Plan: Discharge to home Preterm labor precautions Keep scheduled appointment  Allysen Lazo Paul HalfN Muhammad 10/27/2013, 6:35 AM

## 2013-10-28 LAB — GC/CHLAMYDIA PROBE AMP
CT Probe RNA: NEGATIVE
GC Probe RNA: NEGATIVE

## 2013-10-29 ENCOUNTER — Encounter: Payer: Self-pay | Admitting: Family

## 2013-10-29 DIAGNOSIS — O98213 Gonorrhea complicating pregnancy, third trimester: Secondary | ICD-10-CM | POA: Insufficient documentation

## 2013-11-03 ENCOUNTER — Ambulatory Visit (HOSPITAL_COMMUNITY): Payer: Medicaid Other | Attending: Obstetrics and Gynecology

## 2013-11-07 ENCOUNTER — Other Ambulatory Visit: Payer: Self-pay | Admitting: Obstetrics & Gynecology

## 2013-11-07 DIAGNOSIS — O26879 Cervical shortening, unspecified trimester: Secondary | ICD-10-CM

## 2013-11-12 ENCOUNTER — Encounter (HOSPITAL_COMMUNITY): Payer: Self-pay

## 2013-11-12 ENCOUNTER — Ambulatory Visit (HOSPITAL_COMMUNITY)
Admission: RE | Admit: 2013-11-12 | Discharge: 2013-11-12 | Disposition: A | Discharge status: Home / Self Care | Payer: Medicaid Other | Source: Ambulatory Visit | Attending: Obstetrics and Gynecology | Admitting: Obstetrics and Gynecology

## 2013-11-12 VITALS — BP 120/67 | HR 93 | Wt 166.0 lb

## 2013-11-12 DIAGNOSIS — O26879 Cervical shortening, unspecified trimester: Secondary | ICD-10-CM | POA: Insufficient documentation

## 2013-11-12 DIAGNOSIS — O98213 Gonorrhea complicating pregnancy, third trimester: Secondary | ICD-10-CM

## 2013-11-12 DIAGNOSIS — Z3689 Encounter for other specified antenatal screening: Secondary | ICD-10-CM | POA: Insufficient documentation

## 2013-11-17 ENCOUNTER — Encounter: Payer: Self-pay | Admitting: *Deleted

## 2013-11-18 ENCOUNTER — Inpatient Hospital Stay (HOSPITAL_COMMUNITY)
Admission: AD | Admit: 2013-11-18 | Discharge: 2013-11-18 | Disposition: A | Discharge status: Home / Self Care | Payer: Medicaid Other | Source: Ambulatory Visit | Attending: Obstetrics & Gynecology | Admitting: Obstetrics & Gynecology

## 2013-11-18 ENCOUNTER — Encounter (HOSPITAL_COMMUNITY): Payer: Self-pay | Admitting: *Deleted

## 2013-11-18 DIAGNOSIS — Z87891 Personal history of nicotine dependence: Secondary | ICD-10-CM | POA: Insufficient documentation

## 2013-11-18 DIAGNOSIS — M549 Dorsalgia, unspecified: Secondary | ICD-10-CM | POA: Insufficient documentation

## 2013-11-18 DIAGNOSIS — O4700 False labor before 37 completed weeks of gestation, unspecified trimester: Secondary | ICD-10-CM | POA: Insufficient documentation

## 2013-11-18 DIAGNOSIS — O479 False labor, unspecified: Secondary | ICD-10-CM

## 2013-11-18 LAB — URINALYSIS, ROUTINE W REFLEX MICROSCOPIC
BILIRUBIN URINE: NEGATIVE
GLUCOSE, UA: NEGATIVE mg/dL
Hgb urine dipstick: NEGATIVE
KETONES UR: 15 mg/dL — AB
LEUKOCYTES UA: NEGATIVE
Nitrite: NEGATIVE
Protein, ur: NEGATIVE mg/dL
Specific Gravity, Urine: 1.02 (ref 1.005–1.030)
Urobilinogen, UA: 0.2 mg/dL (ref 0.0–1.0)
pH: 6 (ref 5.0–8.0)

## 2013-11-18 NOTE — MAU Note (Signed)
Patient presents to MAU with c/o lower back pain and vaginal pressure that started an hour ago. States has is worse with walking; pain is a 6/10 on pain scale. Denies LOF or VB at this time. Reports irregular contractions. Reports good fetal movement.

## 2013-11-18 NOTE — MAU Provider Note (Signed)
None     Chief Complaint:  Back Pain and vaginal pressure    Kristin Garner is  23 y.o. G2P1001 at 4968w3d presents complaining of Back Pain and vaginal pressure  Pt was admitted at 32weeks and rec'd steroids for PTL. Cervix was FT at that time. Today, pt was walking today and started having sharp back pain and pressure in her vagina. That would resolve with sitting/laying. Pt describes as sharpy 6/10 pain. Pt also feels pressure when urinating.  No other complications during this pregnancy. Seen at HD.  Obstetrical/Gynecological History: OB History   Grav Para Term Preterm Abortions TAB SAB Ect Mult Living   2 1 1       1      Past Medical History: Past Medical History  Diagnosis Date  . Gonorrhea   . Chlamydia     Past Surgical History: Past Surgical History  Procedure Laterality Date  . No past surgeries      Family History: Family History  Problem Relation Age of Onset  . Lupus Mother     Social History: History  Substance Use Topics  . Smoking status: Former Smoker -- 0.25 packs/day for 1 years    Types: Cigarettes    Quit date: 02/10/2013  . Smokeless tobacco: Not on file  . Alcohol Use: Yes     Comment: last use Nov 2014    Allergies: No Known Allergies  Meds:  Prescriptions prior to admission  Medication Sig Dispense Refill  . Prenatal Vit-Fe Fumarate-FA (PRENATAL MULTIVITAMIN) TABS tablet Take 1 tablet by mouth daily at 12 noon.        Review of Systems -   Review of Systems  No other complaints from above in HPI   Physical Exam  Blood pressure 119/72, pulse 94, temperature 97.9 F (36.6 C), temperature source Oral, resp. rate 18, height 5\' 5"  (1.651 m), weight 78.744 kg (173 lb 9.6 oz), last menstrual period 03/19/2013, SpO2 99.00%. GENERAL: Well-developed, well-nourished female in no acute distress.  ABDOMEN: Soft, nontender, nondistended, gravid.  EXTREMITIES: Nontender, no edema, 2+ distal pulses. DTR's 2+ Dilation: 1 Effacement (%):  Thick Cervical Position: Posterior Presentation: Vertex Exam by:: Dr. Ike Benedom  Presentation: cephalic FHT:  Baseline rate 140s bpm   Variability moderate  Accelerations present ss Decelerations none Contractions: Uterine irritability  Labs: Results for orders placed during the hospital encounter of 11/18/13 (from the past 24 hour(s))  URINALYSIS, ROUTINE W REFLEX MICROSCOPIC   Collection Time    11/18/13  9:11 PM      Result Value Ref Range   Color, Urine YELLOW  YELLOW   APPearance CLEAR  CLEAR   Specific Gravity, Urine 1.020  1.005 - 1.030   pH 6.0  5.0 - 8.0   Glucose, UA NEGATIVE  NEGATIVE mg/dL   Hgb urine dipstick NEGATIVE  NEGATIVE   Bilirubin Urine NEGATIVE  NEGATIVE   Ketones, ur 15 (*) NEGATIVE mg/dL   Protein, ur NEGATIVE  NEGATIVE mg/dL   Urobilinogen, UA 0.2  0.0 - 1.0 mg/dL   Nitrite NEGATIVE  NEGATIVE   Leukocytes, UA NEGATIVE  NEGATIVE   Imaging Studies:  [place] [facility] Follow Up  11/12/2013   OBSTETRICAL ULTRASOUND: This exam was performed within a [facility]. The OB US report was generated in the AS system, and faxed to the ordering physician.   This report is available in the [facility] [employer] PACS. See the [facility] Obstetric US report via the Image Link.   Assessment: Kristin Garner is  23 y.o. G2P1001 at 4123w3d presents with likely contractions while ambulating. Cervix reassuring. Pt given reassurance and discharged home.. PTL precautions reviewed.  Tawana ScaleODOM, MICHAEL RYAN 6/16/20159:38 PM

## 2013-11-18 NOTE — Discharge Instructions (Signed)
Third Trimester of Pregnancy  The third trimester is from week 29 through week 42, months 7 through 9. The third trimester is a time when the fetus is growing rapidly. At the end of the ninth month, the fetus is about 20 inches in length and weighs 6 10 pounds.   BODY CHANGES  Your body goes through many changes during pregnancy. The changes vary from woman to woman.    Your weight will continue to increase. You can expect to gain 25 35 pounds (11 16 kg) by the end of the pregnancy.   You may begin to get stretch marks on your hips, abdomen, and breasts.   You may urinate more often because the fetus is moving lower into your pelvis and pressing on your bladder.   You may develop or continue to have heartburn as a result of your pregnancy.   You may develop constipation because certain hormones are causing the muscles that push waste through your intestines to slow down.   You may develop hemorrhoids or swollen, bulging veins (varicose veins).   You may have pelvic pain because of the weight gain and pregnancy hormones relaxing your joints between the bones in your pelvis. Back aches may result from over exertion of the muscles supporting your posture.   Your breasts will continue to grow and be tender. A yellow discharge may leak from your breasts called colostrum.   Your belly button may stick out.   You may feel short of breath because of your expanding uterus.   You may notice the fetus "dropping," or moving lower in your abdomen.   You may have a bloody mucus discharge. This usually occurs a few days to a week before labor begins.   Your cervix becomes thin and soft (effaced) near your due date.  WHAT TO EXPECT AT YOUR PRENATAL EXAMS   You will have prenatal exams every 2 weeks until week 36. Then, you will have weekly prenatal exams. During a routine prenatal visit:   You will be weighed to make sure you and the fetus are growing normally.   Your blood pressure is taken.   Your abdomen will be  measured to track your baby's growth.   The fetal heartbeat will be listened to.   Any test results from the previous visit will be discussed.   You may have a cervical check near your due date to see if you have effaced.  At around 36 weeks, your caregiver will check your cervix. At the same time, your caregiver will also perform a test on the secretions of the vaginal tissue. This test is to determine if a type of bacteria, Group B streptococcus, is present. Your caregiver will explain this further.  Your caregiver may ask you:   What your birth plan is.   How you are feeling.   If you are feeling the baby move.   If you have had any abnormal symptoms, such as leaking fluid, bleeding, severe headaches, or abdominal cramping.   If you have any questions.  Other tests or screenings that may be performed during your third trimester include:   Blood tests that check for low iron levels (anemia).   Fetal testing to check the health, activity level, and growth of the fetus. Testing is done if you have certain medical conditions or if there are problems during the pregnancy.  FALSE LABOR  You may feel small, irregular contractions that eventually go away. These are called Braxton Hicks contractions, or   false labor. Contractions may last for hours, days, or even weeks before true labor sets in. If contractions come at regular intervals, intensify, or become painful, it is best to be seen by your caregiver.   SIGNS OF LABOR    Menstrual-like cramps.   Contractions that are 5 minutes apart or less.   Contractions that start on the top of the uterus and spread down to the lower abdomen and back.   A sense of increased pelvic pressure or back pain.   A watery or bloody mucus discharge that comes from the vagina.  If you have any of these signs before the 37th week of pregnancy, call your caregiver right away. You need to go to the hospital to get checked immediately.  HOME CARE INSTRUCTIONS    Avoid all  smoking, herbs, alcohol, and unprescribed drugs. These chemicals affect the formation and growth of the baby.   Follow your caregiver's instructions regarding medicine use. There are medicines that are either safe or unsafe to take during pregnancy.   Exercise only as directed by your caregiver. Experiencing uterine cramps is a good sign to stop exercising.   Continue to eat regular, healthy meals.   Wear a good support bra for breast tenderness.   Do not use hot tubs, steam rooms, or saunas.   Wear your seat belt at all times when driving.   Avoid raw meat, uncooked cheese, cat litter boxes, and soil used by cats. These carry germs that can cause birth defects in the baby.   Take your prenatal vitamins.   Try taking a stool softener (if your caregiver approves) if you develop constipation. Eat more high-fiber foods, such as fresh vegetables or fruit and whole grains. Drink plenty of fluids to keep your urine clear or pale yellow.   Take warm sitz baths to soothe any pain or discomfort caused by hemorrhoids. Use hemorrhoid cream if your caregiver approves.   If you develop varicose veins, wear support hose. Elevate your feet for 15 minutes, 3 4 times a day. Limit salt in your diet.   Avoid heavy lifting, wear low heal shoes, and practice good posture.   Rest a lot with your legs elevated if you have leg cramps or low back pain.   Visit your dentist if you have not gone during your pregnancy. Use a soft toothbrush to brush your teeth and be gentle when you floss.   A sexual relationship may be continued unless your caregiver directs you otherwise.   Do not travel far distances unless it is absolutely necessary and only with the approval of your caregiver.   Take prenatal classes to understand, practice, and ask questions about the labor and delivery.   Make a trial run to the hospital.   Pack your hospital bag.   Prepare the baby's nursery.   Continue to go to all your prenatal visits as directed  by your caregiver.  SEEK MEDICAL CARE IF:   You are unsure if you are in labor or if your water has broken.   You have dizziness.   You have mild pelvic cramps, pelvic pressure, or nagging pain in your abdominal area.   You have persistent nausea, vomiting, or diarrhea.   You have a bad smelling vaginal discharge.   You have pain with urination.  SEEK IMMEDIATE MEDICAL CARE IF:    You have a fever.   You are leaking fluid from your vagina.   You have spotting or bleeding from your vagina.     You have severe abdominal cramping or pain.   You have rapid weight loss or gain.   You have shortness of breath with chest pain.   You notice sudden or extreme swelling of your face, hands, ankles, feet, or legs.   You have not felt your baby move in over an hour.   You have severe headaches that do not go away with medicine.   You have vision changes.  Document Released: 05/16/2001 Document Revised: 01/22/2013 Document Reviewed: 07/23/2012  [employer] Patient Information 2014 [employer].

## 2013-11-20 ENCOUNTER — Encounter: Payer: Self-pay | Admitting: *Deleted

## 2013-11-20 ENCOUNTER — Ambulatory Visit: Payer: Medicaid Other | Admitting: Cardiology

## 2013-11-21 ENCOUNTER — Encounter (HOSPITAL_COMMUNITY): Payer: Self-pay | Admitting: *Deleted

## 2013-11-21 ENCOUNTER — Inpatient Hospital Stay (HOSPITAL_COMMUNITY)
Admission: AD | Admit: 2013-11-21 | Discharge: 2013-11-21 | Disposition: A | Discharge status: Home / Self Care | Payer: Medicaid Other | Source: Ambulatory Visit | Attending: Obstetrics & Gynecology | Admitting: Obstetrics & Gynecology

## 2013-11-21 DIAGNOSIS — N898 Other specified noninflammatory disorders of vagina: Secondary | ICD-10-CM | POA: Insufficient documentation

## 2013-11-21 DIAGNOSIS — O4700 False labor before 37 completed weeks of gestation, unspecified trimester: Secondary | ICD-10-CM | POA: Insufficient documentation

## 2013-11-21 DIAGNOSIS — O98213 Gonorrhea complicating pregnancy, third trimester: Secondary | ICD-10-CM

## 2013-11-21 LAB — URINE MICROSCOPIC-ADD ON

## 2013-11-21 LAB — URINALYSIS, ROUTINE W REFLEX MICROSCOPIC
BILIRUBIN URINE: NEGATIVE
Glucose, UA: NEGATIVE mg/dL
HGB URINE DIPSTICK: NEGATIVE
Ketones, ur: NEGATIVE mg/dL
Nitrite: NEGATIVE
PH: 6 (ref 5.0–8.0)
Protein, ur: NEGATIVE mg/dL
Specific Gravity, Urine: 1.025 (ref 1.005–1.030)
Urobilinogen, UA: 0.2 mg/dL (ref 0.0–1.0)

## 2013-11-21 NOTE — MAU Provider Note (Signed)
Attestation of Attending Supervision of Obstetric Fellow: Evaluation and management procedures were performed by the Obstetric Fellow under my supervision and collaboration.  I have reviewed the Obstetric Fellow's note and chart, and I agree with the management and plan.  UGONNA  ANYANWU, MD, FACOG Attending Obstetrician & Gynecologist Faculty Practice, [facility]   

## 2013-11-21 NOTE — [facility] Note (Signed)
[facility] Called today and c/o pelvic pressure and was told to come to [facility] for evaluation. Some mild contractions, vaginal discharge this am x 2. Reports good fetal movement

## 2013-11-21 NOTE — Discharge Instructions (Signed)
Braxton Hicks Contractions °Contractions of the uterus can occur throughout pregnancy. Contractions are not always a sign that you are in labor.  °WHAT ARE BRAXTON HICKS CONTRACTIONS?  °Contractions that occur before labor are called Braxton Hicks contractions, or false labor. Toward the end of pregnancy (32-34 weeks), these contractions can develop more often and may become more forceful. This is not true labor because these contractions do not result in opening (dilatation) and thinning of the cervix. They are sometimes difficult to tell apart from true labor because these contractions can be forceful and people have different pain tolerances. You should not feel embarrassed if you go to the hospital with false labor. Sometimes, the only way to tell if you are in true labor is for your health care provider to look for changes in the cervix. °If there are no prenatal problems or other health problems associated with the pregnancy, it is completely safe to be sent home with false labor and await the onset of true labor. °HOW CAN YOU TELL THE DIFFERENCE BETWEEN TRUE AND FALSE LABOR? °False Labor °· The contractions of false labor are usually shorter and not as hard as those of true labor.   °· The contractions are usually irregular.   °· The contractions are often felt in the front of the lower abdomen and in the groin.   °· The contractions may go away when you walk around or change positions while lying down.   °· The contractions get weaker and are shorter lasting as time goes on.   °· The contractions do not usually become progressively stronger, regular, and closer together as with true labor.   °True Labor °· Contractions in true labor last 30-70 seconds, become very regular, usually become more intense, and increase in frequency.   °· The contractions do not go away with walking.   °· The discomfort is usually felt in the top of the uterus and spreads to the lower abdomen and low back.   °· True labor can be  determined by your health care provider with an exam. This will show that the cervix is dilating and getting thinner.   °WHAT TO REMEMBER °· Keep up with your usual exercises and follow other instructions given by your health care provider.   °· Take medicines as directed by your health care provider.   °· Keep your regular prenatal appointments.   °· Eat and drink lightly if you think you are going into labor.   °· If Braxton Hicks contractions are making you uncomfortable:   °¨ Change your position from lying down or resting to walking, or from walking to resting.   °¨ Sit and rest in a tub of warm water.   °¨ Drink 2-3 glasses of water. Dehydration may cause these contractions.   °¨ Do slow and deep breathing several times an hour.   °WHEN SHOULD I SEEK IMMEDIATE MEDICAL CARE? °Seek immediate medical care if: °· Your contractions become stronger, more regular, and closer together.   °· You have fluid leaking or gushing from your vagina.   °· You have a fever.   °· You pass blood-tinged mucus.   °· You have vaginal bleeding.   °· You have continuous abdominal pain.   °· You have low back pain that you never had before.   °· You feel your baby's head pushing down and causing pelvic pressure.   °· Your baby is not moving as much as it used to.   °Document Released: 05/22/2005 Document Revised: 05/27/2013 Document Reviewed: 03/03/2013 °[employer]® Patient Information ©2015 [employer]. This information is not intended to replace advice given to you by your health care   provider. Make sure you discuss any questions you have with your health care provider. ° °Fetal Movement Counts °Patient Name: __________________________________________________ Patient Due Date: ____________________ °Performing a fetal movement count is highly recommended in high-risk pregnancies, but it is good for every pregnant woman to do. Your caregiver may ask you to start counting fetal movements at 28 weeks of the pregnancy. Fetal movements  often increase: °· After eating a full meal. °· After physical activity. °· After eating or drinking something sweet or cold. °· At rest. °Pay attention to when you feel the baby is most active. This will help you notice a pattern of your baby's sleep and wake cycles and what factors contribute to an increase in fetal movement. It is important to perform a fetal movement count at the same time each day when your baby is normally most active.  °HOW TO COUNT FETAL MOVEMENTS °1. Find a quiet and comfortable area to sit or lie down on your left side. Lying on your left side provides the best blood and oxygen circulation to your baby. °2. Write down the day and time on a sheet of paper or in a journal. °3. Start counting kicks, flutters, swishes, rolls, or jabs in a 2 hour period. You should feel at least 10 movements within 2 hours. °4. If you do not feel 10 movements in 2 hours, wait 2-3 hours and count again. Look for a change in the pattern or not enough counts in 2 hours. °SEEK MEDICAL CARE IF: °· You feel less than 10 counts in 2 hours, tried twice. °· There is no movement in over an hour. °· The pattern is changing or taking longer each day to reach 10 counts in 2 hours. °· You feel the baby is not moving as he or she usually does. °Date: ____________ Movements: ____________ Start time: ____________ Finish time: ____________  °Date: ____________ Movements: ____________ Start time: ____________ Finish time: ____________ °Date: ____________ Movements: ____________ Start time: ____________ Finish time: ____________ °Date: ____________ Movements: ____________ Start time: ____________ Finish time: ____________ °Date: ____________ Movements: ____________ Start time: ____________ Finish time: ____________ °Date: ____________ Movements: ____________ Start time: ____________ Finish time: ____________ °Date: ____________ Movements: ____________ Start time: ____________ Finish time: ____________ °Date: ____________  Movements: ____________ Start time: ____________ Finish time: ____________  °Date: ____________ Movements: ____________ Start time: ____________ Finish time: ____________ °Date: ____________ Movements: ____________ Start time: ____________ Finish time: ____________ °Date: ____________ Movements: ____________ Start time: ____________ Finish time: ____________ °Date: ____________ Movements: ____________ Start time: ____________ Finish time: ____________ °Date: ____________ Movements: ____________ Start time: ____________ Finish time: ____________ °Date: ____________ Movements: ____________ Start time: ____________ Finish time: ____________ °Date: ____________ Movements: ____________ Start time: ____________ Finish time: ____________  °Date: ____________ Movements: ____________ Start time: ____________ Finish time: ____________ °Date: ____________ Movements: ____________ Start time: ____________ Finish time: ____________ °Date: ____________ Movements: ____________ Start time: ____________ Finish time: ____________ °Date: ____________ Movements: ____________ Start time: ____________ Finish time: ____________ °Date: ____________ Movements: ____________ Start time: ____________ Finish time: ____________ °Date: ____________ Movements: ____________ Start time: ____________ Finish time: ____________ °Date: ____________ Movements: ____________ Start time: ____________ Finish time: ____________  °Date: ____________ Movements: ____________ Start time: ____________ Finish time: ____________ °Date: ____________ Movements: ____________ Start time: ____________ Finish time: ____________ °Date: ____________ Movements: ____________ Start time: ____________ Finish time: ____________ °Date: ____________ Movements: ____________ Start time: ____________ Finish time: ____________ °Date: ____________ Movements: ____________ Start time: ____________ Finish time: ____________ °Date: ____________ Movements: ____________ Start time:  ____________ Finish time: ____________ °Date: ____________ Movements: ____________   Start time: ____________ Finish time: ____________  °Date: ____________ Movements: ____________ Start time: ____________ Finish time: ____________ °Date: ____________ Movements: ____________ Start time: ____________ Finish time: ____________ °Date: ____________ Movements: ____________ Start time: ____________ Finish time: ____________ °Date: ____________ Movements: ____________ Start time: ____________ Finish time: ____________ °Date: ____________ Movements: ____________ Start time: ____________ Finish time: ____________ °Date: ____________ Movements: ____________ Start time: ____________ Finish time: ____________ °Date: ____________ Movements: ____________ Start time: ____________ Finish time: ____________  °Date: ____________ Movements: ____________ Start time: ____________ Finish time: ____________ °Date: ____________ Movements: ____________ Start time: ____________ Finish time: ____________ °Date: ____________ Movements: ____________ Start time: ____________ Finish time: ____________ °Date: ____________ Movements: ____________ Start time: ____________ Finish time: ____________ °Date: ____________ Movements: ____________ Start time: ____________ Finish time: ____________ °Date: ____________ Movements: ____________ Start time: ____________ Finish time: ____________ °Date: ____________ Movements: ____________ Start time: ____________ Finish time: ____________  °Date: ____________ Movements: ____________ Start time: ____________ Finish time: ____________ °Date: ____________ Movements: ____________ Start time: ____________ Finish time: ____________ °Date: ____________ Movements: ____________ Start time: ____________ Finish time: ____________ °Date: ____________ Movements: ____________ Start time: ____________ Finish time: ____________ °Date: ____________ Movements: ____________ Start time: ____________ Finish time: ____________ °Date:  ____________ Movements: ____________ Start time: ____________ Finish time: ____________ °Date: ____________ Movements: ____________ Start time: ____________ Finish time: ____________  °Date: ____________ Movements: ____________ Start time: ____________ Finish time: ____________ °Date: ____________ Movements: ____________ Start time: ____________ Finish time: ____________ °Date: ____________ Movements: ____________ Start time: ____________ Finish time: ____________ °Date: ____________ Movements: ____________ Start time: ____________ Finish time: ____________ °Date: ____________ Movements: ____________ Start time: ____________ Finish time: ____________ °Date: ____________ Movements: ____________ Start time: ____________ Finish time: ____________ °Document Released: 06/21/2006 Document Revised: 05/08/2012 Document Reviewed: 03/18/2012 °[employer]® Patient Information ©2015 [employer]. This information is not intended to replace advice given to you by your health care provider. Make sure you discuss any questions you have with your health care provider. ° °

## 2013-12-01 ENCOUNTER — Encounter (HOSPITAL_COMMUNITY): Payer: Self-pay | Admitting: *Deleted

## 2013-12-01 ENCOUNTER — Inpatient Hospital Stay (HOSPITAL_COMMUNITY)
Admission: AD | Admit: 2013-12-01 | Discharge: 2013-12-01 | Disposition: A | Discharge status: Home / Self Care | Payer: Medicaid Other | Source: Ambulatory Visit | Attending: Obstetrics & Gynecology | Admitting: Obstetrics & Gynecology

## 2013-12-01 DIAGNOSIS — O4693 Antepartum hemorrhage, unspecified, third trimester: Secondary | ICD-10-CM

## 2013-12-01 DIAGNOSIS — O4690 Antepartum hemorrhage, unspecified, unspecified trimester: Secondary | ICD-10-CM

## 2013-12-01 DIAGNOSIS — R112 Nausea with vomiting, unspecified: Secondary | ICD-10-CM

## 2013-12-01 DIAGNOSIS — Z87891 Personal history of nicotine dependence: Secondary | ICD-10-CM | POA: Insufficient documentation

## 2013-12-01 DIAGNOSIS — R197 Diarrhea, unspecified: Secondary | ICD-10-CM

## 2013-12-01 DIAGNOSIS — O98213 Gonorrhea complicating pregnancy, third trimester: Secondary | ICD-10-CM

## 2013-12-01 MED ORDER — PROMETHAZINE HCL 25 MG PO TABS: 25.0000 mg | ORAL_TABLET | Freq: Four times a day (QID) | ORAL | Status: AC | PRN

## 2013-12-01 NOTE — MAU Note (Signed)
Patient states she was at the pool this am and when she went to the bathroom had blood in the bathing suit. States she took a shower and has not seen any blood since. Having mild irregular contractions. States she has not been able to eat anything since yesterday, vomiting today. Reports good fetal movement.

## 2013-12-01 NOTE — Discharge Instructions (Signed)

## 2013-12-01 NOTE — MAU Provider Note (Signed)
History     CSN: [id number]  Arrival date and time: 12/01/13 1517   First Provider Initiated Contact with Patient 12/01/13 1647      Chief Complaint  Patient presents with  . Vaginal Bleeding  . Emesis   HPI This is a 23 y.o. female at 1284w2d who presents with c/o episode of bleeding at pool today. No bleediing since. No pain now.   Has been vomiting since yesterday. No fever or diarrhea.   RN Note:  Patient states she was at the pool this am and when she went to the bathroom had blood in the bathing suit. States she took a shower and has not seen any blood since. Having mild irregular contractions. States she has not been able to eat anything since yesterday, vomiting today. Reports good fetal movement.        OB History   Grav Para Term Preterm Abortions TAB SAB Ect Mult Living   2 1 1       1       Past Medical History  Diagnosis Date  . Gonorrhea   . Chlamydia     Past Surgical History  Procedure Laterality Date  . No past surgeries      Family History  Problem Relation Age of Onset  . Lupus Mother   . Hypertension Father   . Migraines Father   . Lupus Maternal Grandmother   . Bipolar disorder Brother   . Personality disorder Brother     History  Substance Use Topics  . Smoking status: Former Smoker -- 0.25 packs/day for 1 years    Types: Cigarettes    Quit date: 02/10/2013  . Smokeless tobacco: Not on file  . Alcohol Use: Yes     Comment: last use Nov 2014    Allergies: No Known Allergies  Prescriptions prior to admission  Medication Sig Dispense Refill  . acetaminophen (TYLENOL) 325 MG tablet Take 325 mg by mouth every 6 (six) hours as needed for headache.      . Prenatal Vit-Fe Fumarate-FA (PRENATAL MULTIVITAMIN) TABS tablet Take 1 tablet by mouth daily at 12 noon.        Review of Systems  Constitutional: Positive for malaise/fatigue. Negative for fever and chills.  Gastrointestinal: Positive for nausea and vomiting. Negative for abdominal  pain, diarrhea and constipation.  Genitourinary: Negative for dysuria.   Physical Exam   Blood pressure 114/59, pulse 53, temperature 99.5 F (37.5 C), temperature source Oral, resp. rate 18, last menstrual period 03/19/2013, SpO2 98.00%.  Physical Exam  Constitutional: She is oriented to person, place, and time. She appears well-developed and well-nourished. No distress.  HENT:  Head: Normocephalic.  Cardiovascular: Normal rate.   Respiratory: Effort normal.  GI: Soft. She exhibits no distension. There is no tenderness. There is no rebound and no guarding.  Genitourinary: Vagina normal and uterus normal. No vaginal discharge (No color to discharge at all, no pink or red) found.  Dilation: 1 Effacement (%): 50 Cervical Position: Posterior Station: -3 Presentation: Vertex Exam by:: Morrison Oldee Carter RN   Musculoskeletal: Normal range of motion.  Neurological: She is alert and oriented to person, place, and time.  Skin: Skin is warm and dry.  Psychiatric: She has a normal mood and affect.   FHR reactive.  No contractions  MAU Course  Procedures  MDM   Assessment and Plan  A:  SIUP at 4784w2d       Probable viral gastroenteritis      Reported vaginal bleeding,  no evidence of blood now  P:  Discussed possibly was hemorrhoid or bloody show from contractions      Labor precautions      Supportive care for gastroenteritis         Medication List         acetaminophen 325 MG tablet  Commonly known as:  TYLENOL  Take 325 mg by mouth every 6 (six) hours as needed for headache.     prenatal multivitamin Tabs tablet  Take 1 tablet by mouth daily at 12 noon.     promethazine 25 MG tablet  Commonly known as:  PHENERGAN  Take 1 tablet (25 mg total) by mouth every 6 (six) hours as needed for nausea or vomiting.         [facility] ServiceWILLIAMS,MARIE 12/01/2013, 4:47 PM

## 2014-03-17 ENCOUNTER — Encounter (HOSPITAL_COMMUNITY): Payer: Self-pay | Admitting: *Deleted

## 2014-04-06 ENCOUNTER — Encounter (HOSPITAL_COMMUNITY): Payer: Self-pay | Admitting: *Deleted

## 2014-05-15 ENCOUNTER — Emergency Department (HOSPITAL_COMMUNITY): Payer: Medicaid - Out of State

## 2014-05-15 ENCOUNTER — Emergency Department (HOSPITAL_COMMUNITY)
Admission: EM | Admit: 2014-05-15 | Discharge: 2014-05-15 | Disposition: A | Discharge status: Home / Self Care | Payer: Medicaid - Out of State | Attending: Emergency Medicine | Admitting: Emergency Medicine

## 2014-05-15 ENCOUNTER — Encounter (HOSPITAL_COMMUNITY): Payer: Self-pay | Admitting: *Deleted

## 2014-05-15 DIAGNOSIS — R102 Pelvic and perineal pain: Secondary | ICD-10-CM

## 2014-05-15 DIAGNOSIS — Z87891 Personal history of nicotine dependence: Secondary | ICD-10-CM | POA: Diagnosis not present

## 2014-05-15 DIAGNOSIS — N390 Urinary tract infection, site not specified: Secondary | ICD-10-CM | POA: Diagnosis not present

## 2014-05-15 DIAGNOSIS — N760 Acute vaginitis: Secondary | ICD-10-CM | POA: Diagnosis not present

## 2014-05-15 DIAGNOSIS — N83202 Unspecified ovarian cyst, left side: Secondary | ICD-10-CM

## 2014-05-15 DIAGNOSIS — Z3202 Encounter for pregnancy test, result negative: Secondary | ICD-10-CM | POA: Insufficient documentation

## 2014-05-15 DIAGNOSIS — N8320 Unspecified ovarian cysts: Secondary | ICD-10-CM | POA: Diagnosis not present

## 2014-05-15 DIAGNOSIS — R1032 Left lower quadrant pain: Secondary | ICD-10-CM | POA: Diagnosis present

## 2014-05-15 DIAGNOSIS — Z8619 Personal history of other infectious and parasitic diseases: Secondary | ICD-10-CM | POA: Insufficient documentation

## 2014-05-15 DIAGNOSIS — B9689 Other specified bacterial agents as the cause of diseases classified elsewhere: Secondary | ICD-10-CM

## 2014-05-15 LAB — URINE MICROSCOPIC-ADD ON

## 2014-05-15 LAB — URINALYSIS, ROUTINE W REFLEX MICROSCOPIC
Glucose, UA: NEGATIVE mg/dL
KETONES UR: NEGATIVE mg/dL
NITRITE: NEGATIVE
PROTEIN: NEGATIVE mg/dL
Specific Gravity, Urine: 1.025 (ref 1.005–1.030)
UROBILINOGEN UA: 0.2 mg/dL (ref 0.0–1.0)
pH: 5.5 (ref 5.0–8.0)

## 2014-05-15 LAB — POC URINE PREG, ED: PREG TEST UR: NEGATIVE

## 2014-05-15 LAB — COMPREHENSIVE METABOLIC PANEL
ALBUMIN: 4.3 g/dL (ref 3.5–5.2)
ALK PHOS: 110 U/L (ref 39–117)
ALT: 16 U/L (ref 0–35)
ANION GAP: 14 (ref 5–15)
AST: 16 U/L (ref 0–37)
BILIRUBIN TOTAL: 0.4 mg/dL (ref 0.3–1.2)
BUN: 11 mg/dL (ref 6–23)
CHLORIDE: 102 meq/L (ref 96–112)
CO2: 24 mEq/L (ref 19–32)
CREATININE: 0.91 mg/dL (ref 0.50–1.10)
Calcium: 9.5 mg/dL (ref 8.4–10.5)
GFR calc Af Amer: >90 mL/min (ref >90)
GFR calc non Af Amer: 88 mL/min — ABNORMAL LOW (ref >90)
Glucose, Bld: 95 mg/dL (ref 70–99)
POTASSIUM: 4.1 meq/L (ref 3.7–5.3)
Sodium: 140 mEq/L (ref 137–147)
TOTAL PROTEIN: 7.9 g/dL (ref 6.0–8.3)

## 2014-05-15 LAB — CBC WITH DIFFERENTIAL/PLATELET
BASOS ABS: 0 10*3/uL (ref 0.0–0.1)
Basophils Relative: 0 % (ref 0–1)
Eosinophils Absolute: 0.1 10*3/uL (ref 0.0–0.7)
Eosinophils Relative: 2 % (ref 0–5)
HCT: 37.8 % (ref 36.0–46.0)
Hemoglobin: 11.8 g/dL — ABNORMAL LOW (ref 12.0–15.0)
Lymphocytes Relative: 37 % (ref 12–46)
Lymphs Abs: 2.2 10*3/uL (ref 0.7–4.0)
MCH: 25.6 pg — AB (ref 26.0–34.0)
MCHC: 31.2 g/dL (ref 30.0–36.0)
MCV: 82 fL (ref 78.0–100.0)
Monocytes Absolute: 0.5 10*3/uL (ref 0.1–1.0)
Monocytes Relative: 8 % (ref 3–12)
NEUTROS ABS: 3.1 10*3/uL (ref 1.7–7.7)
NEUTROS PCT: 53 % (ref 43–77)
Platelets: 202 10*3/uL (ref 150–400)
RBC: 4.61 MIL/uL (ref 3.87–5.11)
RDW: 14.7 % (ref 11.5–15.5)
WBC: 5.9 10*3/uL (ref 4.0–10.5)

## 2014-05-15 LAB — WET PREP, GENITAL
Trich, Wet Prep: NONE SEEN
Yeast Wet Prep HPF POC: NONE SEEN

## 2014-05-15 MED ORDER — ONDANSETRON HCL 4 MG/2ML IJ SOLN
4.0000 mg | Freq: Once | INTRAMUSCULAR | Status: AC
  Administered 2014-05-15: 4 mg via INTRAVENOUS
  Filled 2014-05-15: qty 2

## 2014-05-15 MED ORDER — AZITHROMYCIN 1 G PO PACK
1.0000 g | PACK | Freq: Once | ORAL | Status: AC
  Administered 2014-05-15: 1 g via ORAL
  Filled 2014-05-15: qty 1

## 2014-05-15 MED ORDER — KETOROLAC TROMETHAMINE 30 MG/ML IJ SOLN
30.0000 mg | Freq: Once | INTRAMUSCULAR | Status: AC
  Administered 2014-05-15: 30 mg via INTRAVENOUS
  Filled 2014-05-15: qty 1

## 2014-05-15 MED ORDER — DEXTROSE 5 % IV SOLN
1.0000 g | Freq: Once | INTRAVENOUS | Status: AC
  Administered 2014-05-15: 1 g via INTRAVENOUS
  Filled 2014-05-15: qty 10

## 2014-05-15 MED ORDER — METRONIDAZOLE 500 MG PO TABS: 500.0000 mg | ORAL_TABLET | Freq: Two times a day (BID) | ORAL | Status: AC

## 2014-05-15 MED ORDER — SODIUM CHLORIDE 0.9 % IV BOLUS (SEPSIS)
1000.0000 mL | Freq: Once | INTRAVENOUS | Status: AC
  Administered 2014-05-15: 1000 mL via INTRAVENOUS

## 2014-05-15 MED ORDER — METRONIDAZOLE 500 MG PO TABS
500.0000 mg | ORAL_TABLET | Freq: Once | ORAL | Status: AC
  Administered 2014-05-15: 500 mg via ORAL
  Filled 2014-05-15: qty 1

## 2014-05-15 MED ORDER — CIPROFLOXACIN HCL 500 MG PO TABS: 500.0000 mg | ORAL_TABLET | Freq: Two times a day (BID) | ORAL | Status: AC

## 2014-05-15 NOTE — ED Notes (Signed)
Patient back from US Patient in NAD 

## 2014-05-15 NOTE — ED Notes (Signed)
Patient transported to Ultrasound Will medicate when back in room

## 2014-05-15 NOTE — Discharge Instructions (Signed)
Take motrin for pain for ruptured cyst.   Take cipro and flagyl for a week.   Follow up with your doctor.   Return to ER if you have severe pain, vomiting, fever.

## 2014-05-15 NOTE — ED Provider Notes (Addendum)
CSN: [id number]     Arrival date & time 05/15/14  [id number] History   First MD Initiated Contact with Patient 05/15/14 [phone]     Chief Complaint  Patient presents with  . Abdominal Pain     (Consider location/radiation/quality/duration/timing/severity/associated sxs/prior Treatment) The history is provided by the patient.  Kristin LatheFelicia Garner is a 23 y.o. female hx of ovarian cysts here presenting with left lower quadrant pain. Acute onset of left lower quadrant pain this morning. She denies any nausea vomiting or fevers. States that sharp and nonradiating. Denies any associated urinary symptoms. Had similar pain before that was related to ovarian cysts. Has some whitish vaginal discharge and had her period a week ago. Denies being pregnant. Felt a little light headed and dizzy but denies chest pain or shortness of breath or passing out.    Past Medical History  Diagnosis Date  . Gonorrhea   . Chlamydia    Past Surgical History  Procedure Laterality Date  . No past surgeries     Family History  Problem Relation Age of Onset  . Lupus Mother   . Hypertension Father   . Migraines Father   . Lupus Maternal Grandmother   . Bipolar disorder Brother   . Personality disorder Brother    History  Substance Use Topics  . Smoking status: Former Smoker -- 0.25 packs/day for 1 years    Types: Cigarettes    Quit date: 02/10/2013  . Smokeless tobacco: Not on file  . Alcohol Use: Yes     Comment: last use Nov 2014   OB History    Gravida Para Term Preterm AB TAB SAB Ectopic Multiple Living   2 1 1       1      Review of Systems  Gastrointestinal: Positive for abdominal pain.  All other systems reviewed and are negative.     Allergies  Review of patient's allergies indicates no known allergies.  Home Medications   Prior to Admission medications   Medication Sig Start Date End Date Taking? Authorizing Provider  promethazine (PHENERGAN) 25 MG tablet Take 1 tablet (25 mg total) by mouth  every 6 (six) hours as needed for nausea or vomiting. Patient not taking: Reported on 05/15/2014 12/01/13   Aviva SignsMarie L Williams, CNM   BP 118/63 mmHg  Pulse 89  Temp(Src) 97.4 F (36.3 C) (Oral)  Resp 20  SpO2 100%  LMP 05/05/2014  Breastfeeding? Unknown Physical Exam  Constitutional: She is oriented to person, place, and time. She appears well-developed and well-nourished.  HENT:  Head: Normocephalic.  Mouth/Throat: Oropharynx is clear and moist.  Eyes: Conjunctivae are normal. Pupils are equal, round, and reactive to light.  Neck: Normal range of motion. Neck supple.  Cardiovascular: Normal rate, regular rhythm and normal heart sounds.   Pulmonary/Chest: Effort normal and breath sounds normal. No respiratory distress. She has no wheezes. She has no rales.  Abdominal: Soft. Bowel sounds are normal.  + LLQ tenderness, no rebound   Genitourinary:  Whitish discharge. No CMT. No uterine tenderness. + L adnexal tenderness, no rebound   Musculoskeletal: Normal range of motion. She exhibits no edema or tenderness.  Neurological: She is alert and oriented to person, place, and time. No cranial nerve deficit. Coordination normal.  Skin: Skin is warm and dry.  Psychiatric: She has a normal mood and affect. Her behavior is normal. Judgment and thought content normal.  Nursing note and vitals reviewed.   ED Course  Procedures (including critical care time) Labs  Review Labs Reviewed  WET PREP, GENITAL - Abnormal; Notable for the following:    Clue Cells Wet Prep HPF POC MANY (*)    WBC, Wet Prep HPF POC MANY (*)    All other components within normal limits  URINALYSIS, ROUTINE W REFLEX MICROSCOPIC - Abnormal; Notable for the following:    APPearance CLOUDY (*)    Hgb urine dipstick SMALL (*)    Bilirubin Urine SMALL (*)    Leukocytes, UA LARGE (*)    All other components within normal limits  CBC WITH DIFFERENTIAL - Abnormal; Notable for the following:    Hemoglobin 11.8 (*)    MCH  25.6 (*)    All other components within normal limits  COMPREHENSIVE METABOLIC PANEL - Abnormal; Notable for the following:    GFR calc non Af Amer 88 (*)    All other components within normal limits  URINE MICROSCOPIC-ADD ON - Abnormal; Notable for the following:    Squamous Epithelial / LPF FEW (*)    Bacteria, UA FEW (*)    All other components within normal limits  GC/CHLAMYDIA PROBE AMP  POC URINE PREG, ED    Imaging Review US Transvaginal Non-ob  05/15/2014   CLINICAL DATA:  23 year old female with left lower quadrant pain for 2 days. Initial encounter.  EXAM: TRANSABDOMINAL AND TRANSVAGINAL ULTRASOUND OF PELVIS  TECHNIQUE: Both transabdominal and transvaginal ultrasound examinations of the pelvis were performed. Transabdominal technique was performed for global imaging of the pelvis including uterus, ovaries, adnexal regions, and pelvic cul-de-sac. It was necessary to proceed with endovaginal exam following the transabdominal exam to visualize the ovaries.  COMPARISON:  Ob ultrasound 11/12/2013 and earlier.  FINDINGS: Uterus  Measurements: 7.7 x 4.7 x 4.8 cm. No fibroids or other mass visualized.  Endometrium  Thickness: 6 mm.  No focal abnormality visualized.  Right ovary  Measurements: 3.1 x 2.0 x 2.3 cm. Multiple small follicles. Normal appearance/no adnexal mass.  Left ovary  Measurements: 3.4 x 2.2 x 1.9 cm. Small follicles, up to 12 mm. Normal appearance/no adnexal mass.  Other findings  Trace simple appearing pelvic free fluid.  IMPRESSION: Normal for age pelvis ultrasound.   Electronically Signed   By: Augusto Gamble M.D.   On: 05/15/2014 09:44   US Pelvis Complete  05/15/2014   CLINICAL DATA:  23 year old female with left lower quadrant pain for 2 days. Initial encounter.  EXAM: TRANSABDOMINAL AND TRANSVAGINAL ULTRASOUND OF PELVIS  TECHNIQUE: Both transabdominal and transvaginal ultrasound examinations of the pelvis were performed. Transabdominal technique was performed for global  imaging of the pelvis including uterus, ovaries, adnexal regions, and pelvic cul-de-sac. It was necessary to proceed with endovaginal exam following the transabdominal exam to visualize the ovaries.  COMPARISON:  Ob ultrasound 11/12/2013 and earlier.  FINDINGS: Uterus  Measurements: 7.7 x 4.7 x 4.8 cm. No fibroids or other mass visualized.  Endometrium  Thickness: 6 mm.  No focal abnormality visualized.  Right ovary  Measurements: 3.1 x 2.0 x 2.3 cm. Multiple small follicles. Normal appearance/no adnexal mass.  Left ovary  Measurements: 3.4 x 2.2 x 1.9 cm. Small follicles, up to 12 mm. Normal appearance/no adnexal mass.  Other findings  Trace simple appearing pelvic free fluid.  IMPRESSION: Normal for age pelvis ultrasound.   Electronically Signed   By: Augusto Gamble M.D.   On: 05/15/2014 09:44     EKG Interpretation None      MDM   Final diagnoses:  Adnexal tenderness, left    Rey  Sibyl ParrChapman is a 23 y.o. female here with LLQ pain, dizziness. UCG neg. Likely ovarian cyst, less likely to have diverticulitis given absence of nausea or fever or constipation. Will get labs, UA, US. Patient not orthostatic.  10:11 AM US showed small trace pelvic fluid with small follicles. Likely ruptured cyst. UA + UTI. Given ceftriaxone. Wet prep + BV. She wants empiric treatment for GC/chlamydia. Will dc on cipro, flagyl, motrin.     Richardean Canalavid H Annasofia Pohl, MD 05/15/14 1012  Richardean Canalavid H Shelba Susi, MD 05/15/14 1027

## 2014-05-15 NOTE — ED Notes (Addendum)
Patient called EMS for LUQ pain No N/V Patient ambulatory from ambulance bay

## 2014-05-15 NOTE — ED Notes (Signed)
Bed: WA24 Expected date:  Expected time:  Means of arrival:  Comments: EMS 

## 2014-05-15 NOTE — ED Notes (Signed)
Patient now states that she has been "dizzy"--starting this morning Patient ambulatory to bathroom

## 2014-05-16 LAB — GC/CHLAMYDIA PROBE AMP
CT PROBE, AMP APTIMA: POSITIVE — AB
GC Probe RNA: POSITIVE — AB

## 2014-05-17 ENCOUNTER — Telehealth: Payer: Self-pay | Admitting: Emergency Medicine

## 2014-05-17 LAB — URINE CULTURE

## 2014-05-17 NOTE — Telephone Encounter (Signed)
+  Chlamydia. +Gonorrhea. Patient treated with Rocephin and Zithromax. [employer] faxed. 

## 2014-05-18 ENCOUNTER — Telehealth (HOSPITAL_COMMUNITY): Payer: Self-pay

## 2014-05-18 NOTE — ED Notes (Signed)
Unable to reach by telephone. Letter sent to address on record.  

## 2014-05-18 NOTE — ED Notes (Signed)
Post ED Visit - Positive Culture Follow-up  Culture report reviewed by antimicrobial stewardship pharmacist: []  Wes Dulaney, Pharm.D., BCPS [x]  Celedonio MiyamotoJeremy Frens, Pharm.D., BCPS []  Georgina PillionElizabeth Martin, Pharm.D., BCPS []  MadelineMinh Pham, [address]Ph[address] BCPS, AAHIVP []  Estella HuskMichelle Turner, Pharm.D., BCPS, AAHIVP []  Elder CyphersLorie Poole, [address]Pharm[address]D., BCPS  Positive urine culture Treated with cipro, organism sensitive to the same and no further patient follow-up is required at this time.  Ashley JacobsFesterman, Dymir Neeson C 05/18/2014, 10:37 AM

## 2014-05-19 IMAGING — US US OB COMP LESS 14 WK
1 series · 14 of 18 positions shown · non-contrast
Comparison: None.

CLINICAL DATA: Abdominal pain for 1 day. No history of hypertension
or gestational diabetes. By LMP, the patient is 7 weeks 5 days. EDC
by LMP is 12/24/2013.

EXAM:
OBSTETRIC <14 WK ULTRASOUND
TECHNIQUE: Transabdominal ultrasound was performed for evaluation of the
gestation as well as the maternal uterus and adnexal regions.

[Series 1: us ob comp less 14 wks · 18 acquisitions, 14 frames shown]
[im 1/18]
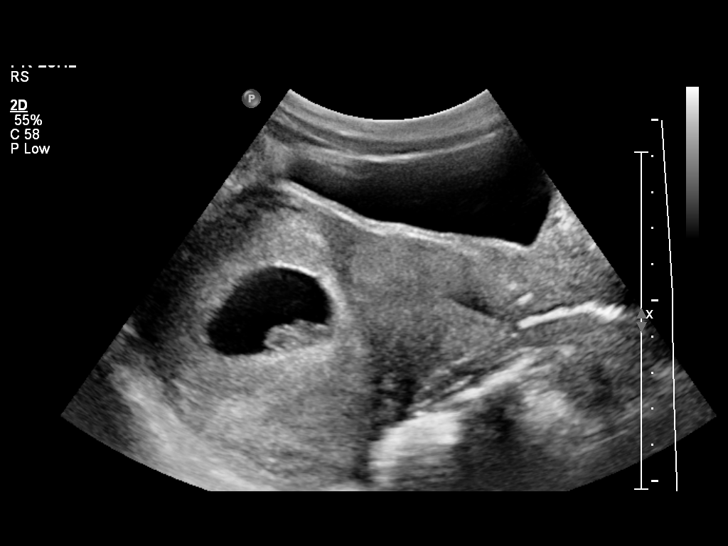
[im 2/18]
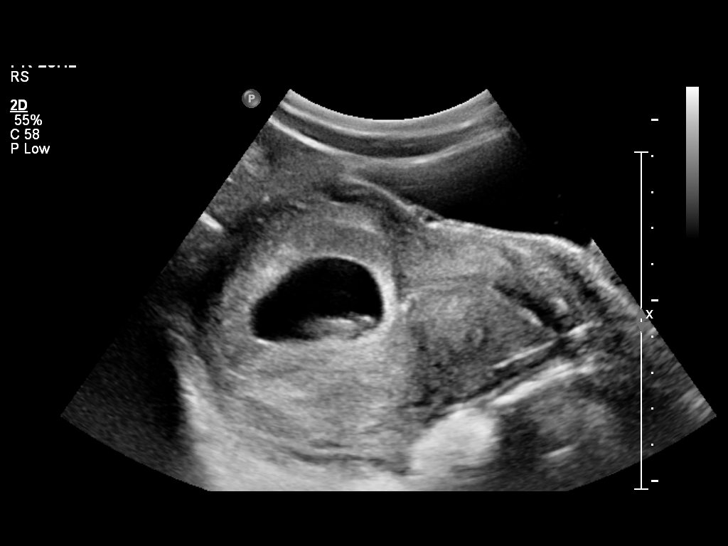
[im 4/18]
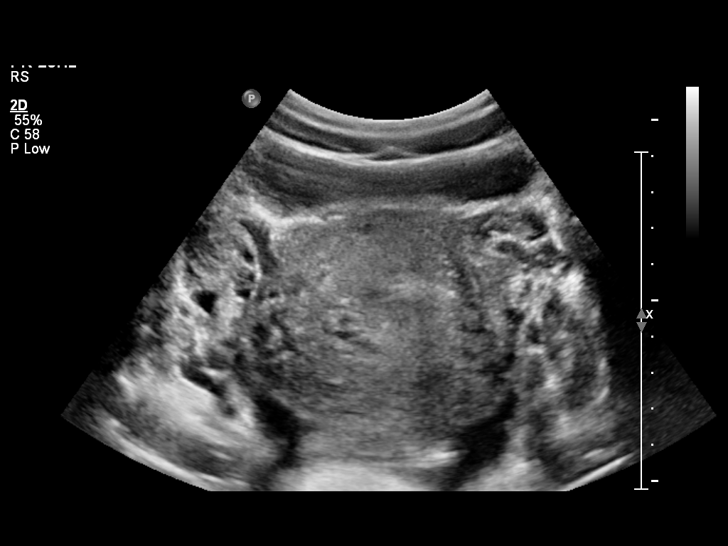
[im 5/18]
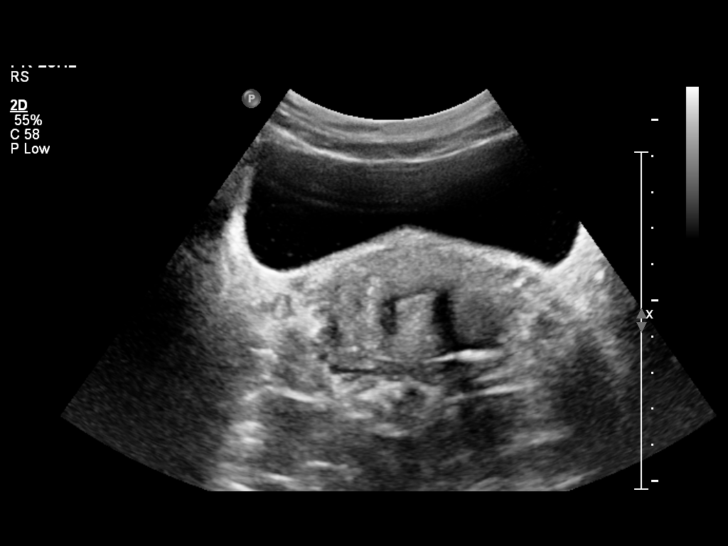
[im 6/18]
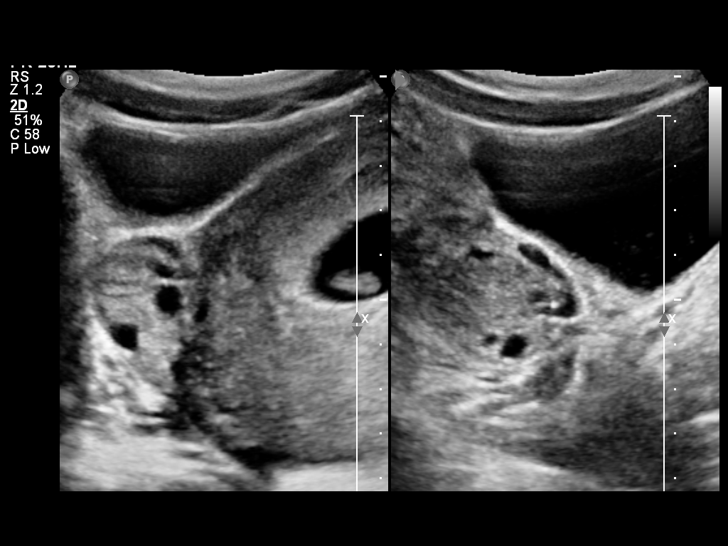
[im 8/18]
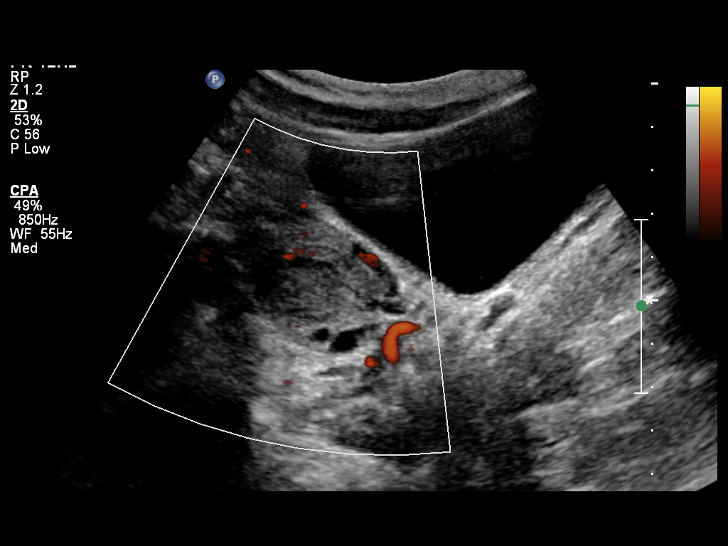
[im 9/18]
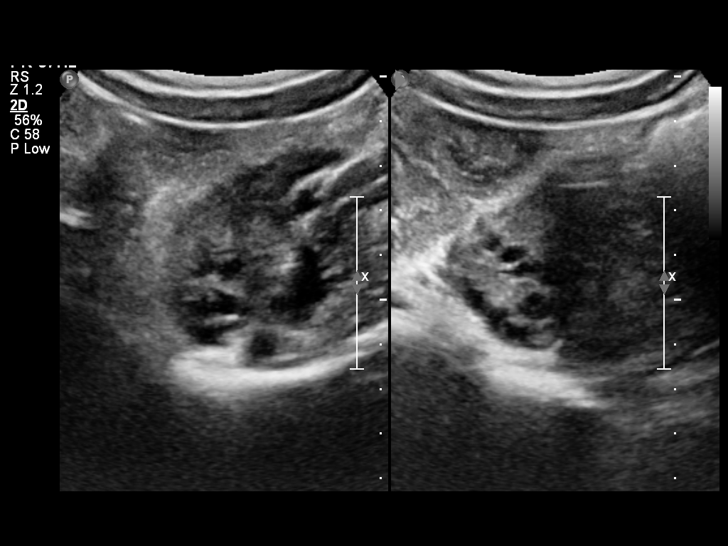
[im 10/18]
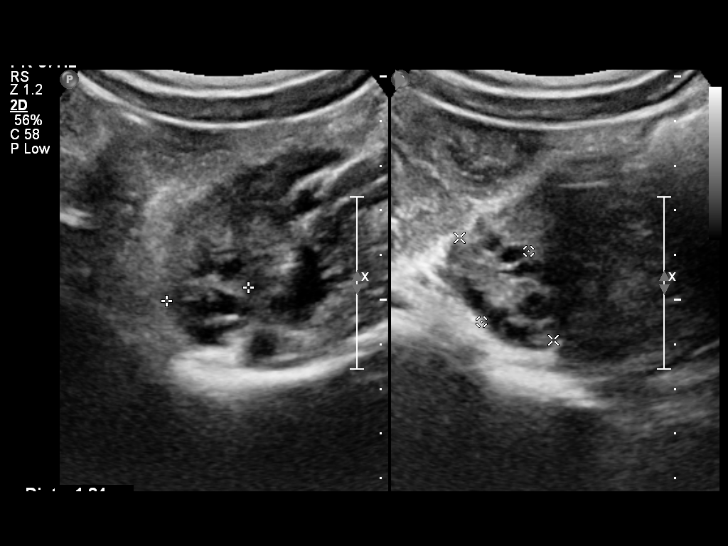
[im 11/18]
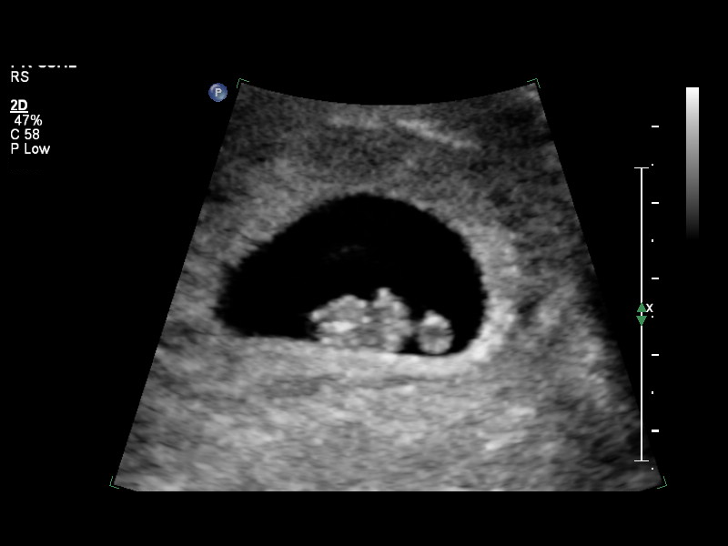
[im 13/18]
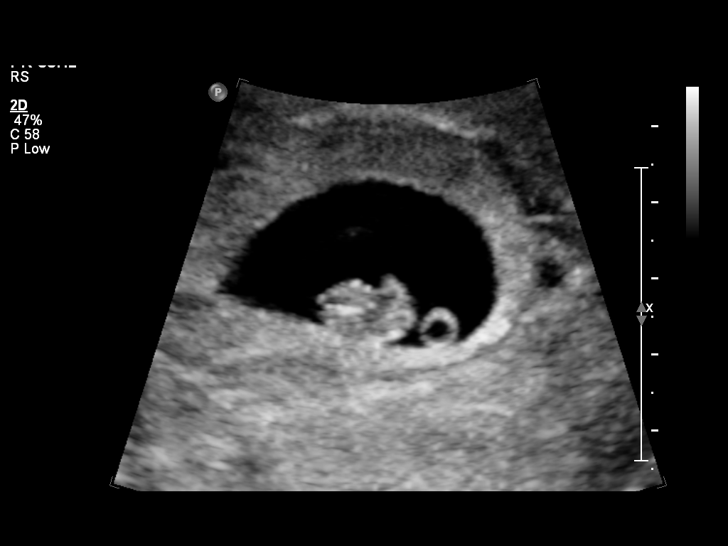
[im 14/18]
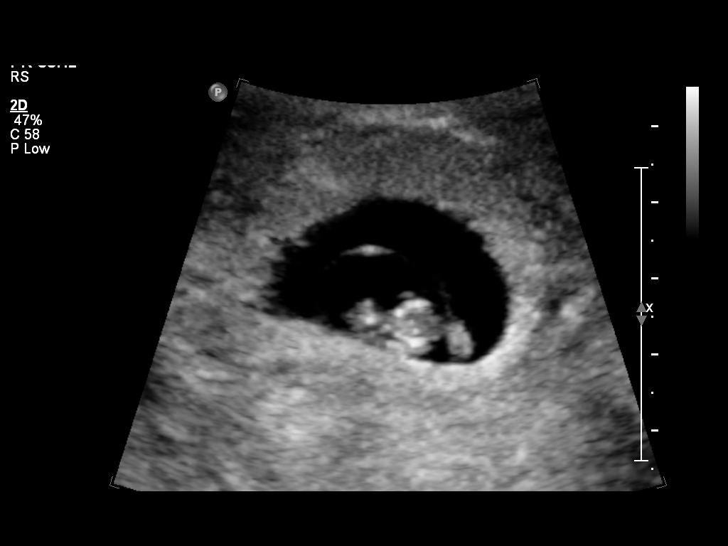
[im 15/18]
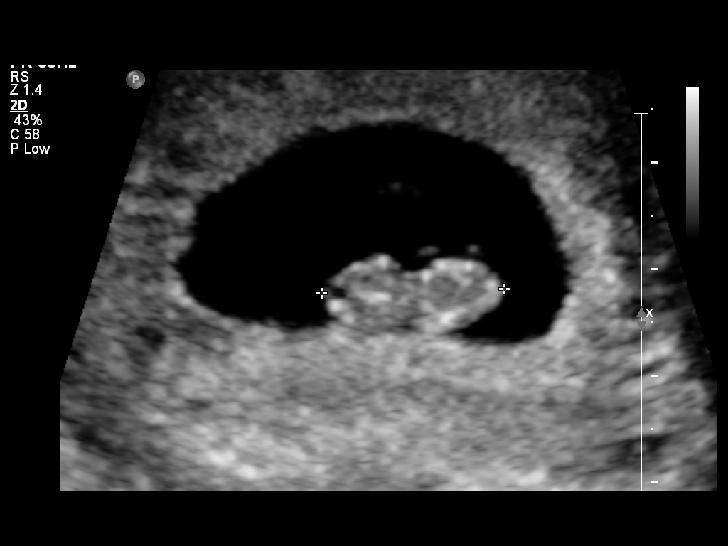
[im 17/18]
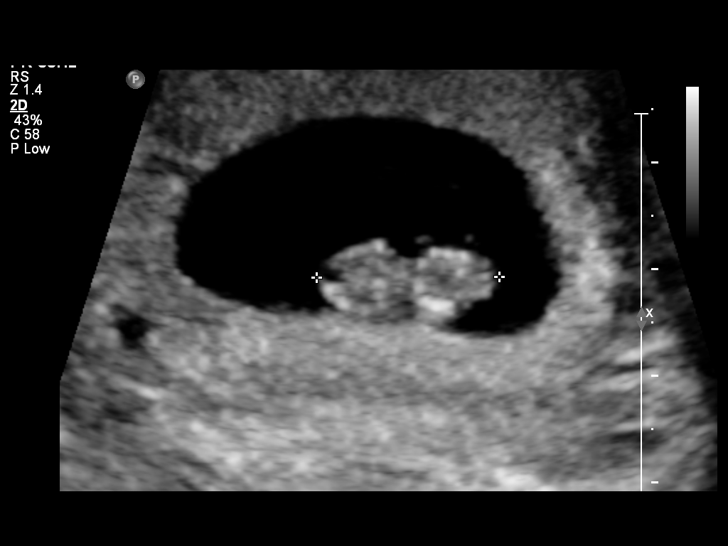
[im 18/18]
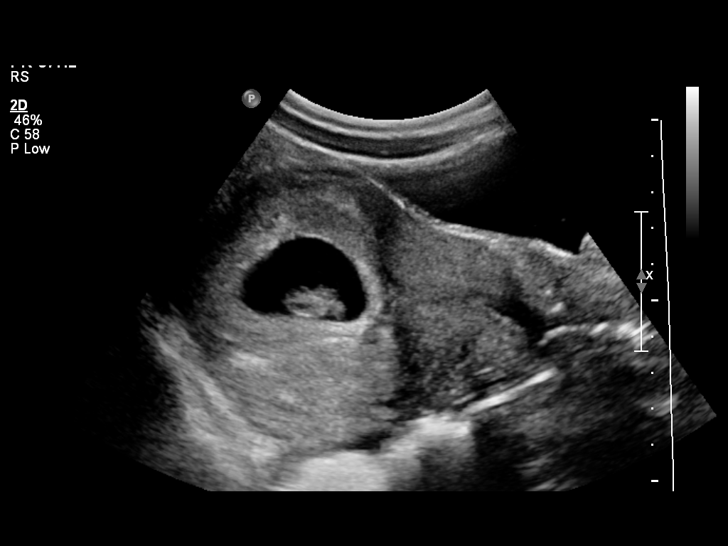

[14 of 18 positions shown; findings below may reference images not displayed]

FINDINGS: Intrauterine gestational sac: Visualized/normal in shape.

Yolk sac:  Present

Embryo:  Present

Cardiac Activity: Present

Heart Rate: 174 bpm

CRL:   17.2  mm   8 w 2d                  US EDC: 12/20/2012

Maternal uterus/adnexae: No subchorionic hemorrhage identified. Note
is made of right corpus luteum cyst. The left ovary has a normal
appearance. No free pelvic fluid.
IMPRESSION: 1. Intrauterine embryo at 8 weeks 2 days by ultrasound.
2. Size and dates correlate well, confirming clinical dating of 7
weeks 5 days.

## 2014-05-21 ENCOUNTER — Emergency Department (HOSPITAL_COMMUNITY)
Admission: EM | Admit: 2014-05-21 | Discharge: 2014-05-21 | Disposition: A | Discharge status: Home / Self Care | Payer: Medicaid - Out of State | Attending: Emergency Medicine | Admitting: Emergency Medicine

## 2014-05-21 ENCOUNTER — Encounter (HOSPITAL_COMMUNITY): Payer: Self-pay | Admitting: Emergency Medicine

## 2014-05-21 DIAGNOSIS — Z87891 Personal history of nicotine dependence: Secondary | ICD-10-CM | POA: Insufficient documentation

## 2014-05-21 DIAGNOSIS — N730 Acute parametritis and pelvic cellulitis: Secondary | ICD-10-CM | POA: Insufficient documentation

## 2014-05-21 DIAGNOSIS — Z8742 Personal history of other diseases of the female genital tract: Secondary | ICD-10-CM

## 2014-05-21 DIAGNOSIS — Z8619 Personal history of other infectious and parasitic diseases: Secondary | ICD-10-CM | POA: Insufficient documentation

## 2014-05-21 DIAGNOSIS — Z3202 Encounter for pregnancy test, result negative: Secondary | ICD-10-CM | POA: Insufficient documentation

## 2014-05-21 LAB — URINALYSIS, ROUTINE W REFLEX MICROSCOPIC
Bilirubin Urine: NEGATIVE
Glucose, UA: NEGATIVE mg/dL
Hgb urine dipstick: NEGATIVE
Ketones, ur: NEGATIVE mg/dL
NITRITE: NEGATIVE
PH: 6 (ref 5.0–8.0)
Protein, ur: NEGATIVE mg/dL
SPECIFIC GRAVITY, URINE: 1.022 (ref 1.005–1.030)
Urobilinogen, UA: 1 mg/dL (ref 0.0–1.0)

## 2014-05-21 LAB — URINE MICROSCOPIC-ADD ON

## 2014-05-21 LAB — GC/CHLAMYDIA PROBE AMP
CT Probe RNA: NEGATIVE
GC PROBE AMP APTIMA: NEGATIVE

## 2014-05-21 LAB — WET PREP, GENITAL
Clue Cells Wet Prep HPF POC: NONE SEEN
Trich, Wet Prep: NONE SEEN
Yeast Wet Prep HPF POC: NONE SEEN

## 2014-05-21 LAB — POC URINE PREG, ED: PREG TEST UR: NEGATIVE

## 2014-05-21 MED ORDER — HYDROCODONE-ACETAMINOPHEN 5-325 MG PO TABS: 1.0000 | ORAL_TABLET | Freq: Four times a day (QID) | ORAL | Status: AC | PRN

## 2014-05-21 MED ORDER — LIDOCAINE HCL 1 % IJ SOLN
INTRAMUSCULAR | Status: AC
  Administered 2014-05-21: 0.9 mL
  Filled 2014-05-21: qty 20

## 2014-05-21 MED ORDER — DOXYCYCLINE HYCLATE 100 MG PO CAPS: 100.0000 mg | ORAL_CAPSULE | Freq: Two times a day (BID) | ORAL | Status: AC

## 2014-05-21 MED ORDER — CEFTRIAXONE SODIUM 250 MG IJ SOLR
250.0000 mg | Freq: Once | INTRAMUSCULAR | Status: AC
  Administered 2014-05-21: 250 mg via INTRAMUSCULAR
  Filled 2014-05-21: qty 250

## 2014-05-21 MED ORDER — MORPHINE SULFATE 4 MG/ML IJ SOLN
4.0000 mg | Freq: Once | INTRAMUSCULAR | Status: AC
  Administered 2014-05-21: 4 mg via INTRAMUSCULAR
  Filled 2014-05-21: qty 1

## 2014-05-21 NOTE — ED Provider Notes (Signed)
CSN: [id number]     Arrival date & time 05/21/14  0217 History   First MD Initiated Contact with Patient 05/21/14 [phone]     Chief Complaint  Patient presents with  . Abdominal Pain     (Consider location/radiation/quality/duration/timing/severity/associated sxs/prior Treatment) HPI Comments: The patient is a [age] year old female with possible history of ovarian cyst presenting with left lower quadrant pain. Patient reports ongoing abdominal discomfort for several days, seen in ED several days ago for similar complaints. She reports discomfort as sharp worsened with laying on that side. She reports one episode of diarrhea today. Denies nausea or vomiting. She reports persistent vaginal discharge since last seen 12/11. Gonorrhea and chlamydia positive. She reports they were unable to contact her due to having a outdated number.  Patient is a 23 y.o. female presenting with abdominal pain. The history is provided by the patient and medical records. No language interpreter was used.  Abdominal Pain Associated symptoms: diarrhea and vaginal discharge   Associated symptoms: no chills, no constipation, no dysuria, no fever, no nausea, no vaginal bleeding and no vomiting     Past Medical History  Diagnosis Date  . Gonorrhea   . Chlamydia    Past Surgical History  Procedure Laterality Date  . No past surgeries     Family History  Problem Relation Age of Onset  . Lupus Mother   . Hypertension Father   . Migraines Father   . Lupus Maternal Grandmother   . Bipolar disorder Brother   . Personality disorder Brother    History  Substance Use Topics  . Smoking status: Former Smoker -- 0.25 packs/day for 1 years    Types: Cigarettes    Quit date: 02/10/2013  . Smokeless tobacco: Not on file  . Alcohol Use: Yes   OB History    Gravida Para Term Preterm AB TAB SAB Ectopic Multiple Living   2 1 1       1      Review of Systems  Constitutional: Negative for fever and chills.   Gastrointestinal: Positive for abdominal pain and diarrhea. Negative for nausea, vomiting, constipation and blood in stool.  Genitourinary: Positive for vaginal discharge and pelvic pain. Negative for dysuria and vaginal bleeding.      Allergies  Review of patient's allergies indicates no known allergies.  Home Medications   Prior to Admission medications   Medication Sig Start Date End Date Taking? Authorizing Provider  ibuprofen (ADVIL,MOTRIN) 200 MG tablet Take 400 mg by mouth every 6 (six) hours as needed for moderate pain.   Yes Historical Provider, MD  ciprofloxacin (CIPRO) 500 MG tablet Take 1 tablet (500 mg total) by mouth 2 (two) times daily. One po bid x 7 days Patient not taking: Reported on 05/21/2014 05/15/14   Richardean Canalavid H Yao, MD  metroNIDAZOLE (FLAGYL) 500 MG tablet Take 1 tablet (500 mg total) by mouth 2 (two) times daily. One po bid x 7 days Patient not taking: Reported on 05/21/2014 05/15/14   Richardean Canalavid H Yao, MD  promethazine (PHENERGAN) 25 MG tablet Take 1 tablet (25 mg total) by mouth every 6 (six) hours as needed for nausea or vomiting. Patient not taking: Reported on 05/15/2014 12/01/13   Aviva SignsMarie L Williams, CNM   BP 110/82 mmHg  Pulse 68  Temp(Src) 97.5 F (36.4 C) (Oral)  Resp 16  Ht 5\' 4"  (1.626 m)  Wt 138 lb 4 oz (62.71 kg)  BMI 23.72 kg/m2  SpO2 100%  LMP 05/05/2014 Physical Exam  Constitutional:  She is oriented to person, place, and time. She appears well-developed and well-nourished. No distress.  HENT:  Head: Normocephalic and atraumatic.  Eyes: EOM are normal. No scleral icterus.  Neck: Neck supple.  Cardiovascular: Normal rate.   Pulmonary/Chest: Effort normal. She has no wheezes. She has no rales.  Abdominal: Soft. There is tenderness in the left lower quadrant. There is no guarding.  Mild left lower quadrant tenderness with palpation.  Genitourinary: There is no lesion on the right labia. There is no lesion on the left labia. Cervix exhibits no  motion tenderness and no friability. Right adnexum displays tenderness. Right adnexum displays no mass. Left adnexum displays tenderness. Left adnexum displays no mass.  Moderate amount of whitish yellow discharge in posterior vaginal vault. Chaperone present.  Musculoskeletal: Normal range of motion.  Neurological: She is alert and oriented to person, place, and time.  Skin: Skin is warm and dry.  Psychiatric: She has a normal mood and affect. Her behavior is normal.  Nursing note and vitals reviewed.   ED Course  Procedures (including critical care time) Labs Review Labs Reviewed  URINALYSIS, ROUTINE W REFLEX MICROSCOPIC - Abnormal; Notable for the following:    Leukocytes, UA MODERATE (*)    All other components within normal limits  WET PREP, GENITAL  GC/CHLAMYDIA PROBE AMP  URINE MICROSCOPIC-ADD ON  POC URINE PREG, ED    Imaging Review No results found.   EKG Interpretation None      MDM   Final diagnoses:  PID (acute pelvic inflammatory disease)  History of ovarian cyst   Patient recently seen and treated for presumptive gonorrhea and chlamydia, positive test. Patient returned with persistent left lower quadrant discomfort and vaginal discharge. Exam shows bilateral adnexa tenderness, mild. Plan to treat for PID. Patient also ovulating by last menstrual period discomfort could also likely be due to ovarian cyst.  Discussed several times with the patient safe sexual practices, need for partner to be treated. [facility] sign [facility] at shift change awaiting wet prep and proper treatment.  Meds given in ED:  Medications  morphine 4 MG/ML injection 4 mg (4 mg Intramuscular Given 05/21/14 0549)  cefTRIAXone (ROCEPHIN) injection 250 mg (250 mg Intramuscular Given 05/21/14 0702)  lidocaine (XYLOCAINE) 1 % (with pres) injection (0.9 mLs  Given 05/21/14 0706)    Discharge Medication List as of 05/21/2014  6:17 AM    START taking these medications   Details   doxycycline (VIBRAMYCIN) 100 MG capsule Take 1 capsule (100 mg total) by mouth 2 (two) times daily., Starting 05/21/2014, Until Discontinued, Print    HYDROcodone-acetaminophen (NORCO/VICODIN) 5-325 MG per tablet Take 1 tablet by mouth every 6 (six) hours as needed for moderate pain or severe pain., Starting 05/21/2014, Until Discontinued, Print        Mellody DrownLauren Maxden Naji, PA-C 05/21/14 [id number]  Suzi RootsKevin E Steinl, MD 05/22/14 [phone]

## 2014-05-21 NOTE — Discharge Instructions (Signed)
Please abstain from sexual intercourse while taking antibiotics and 1 week after completion of antibiotics. Please make sure all of your partners are treated for gonorrhea and chlamydia before engaging in sexual intercourse. You have been treated in the emergency department for an infection, possibly sexually transmitted. Results of your gonorrhea and chlamydia tests are pending and you will be notified if they are positive. It is very important to practice safe sex and use condoms when sexually active. If your results are positive you need to notify all sexual partners so they can be treated as well. The website [email or web address] can be used to send anonymous text messages or emails to alert sexual contacts. Follow up with your doctor, or OBGYN in regards to today's visit.    Gonorrhea and Chlamydia SYMPTOMS  In females, symptoms may go unnoticed. Symptoms that are more noticeable can include:  Belly (abdominal) pain.  Painful intercourse.  Watery mucous-like discharge from the vagina.  Miscarriage.  Discomfort when urinating.  Inflammation of the rectum.  Abnormal gray-green frothy vaginal discharge  Vaginal itching and irritatio  Itching and irritation of the area outside the vagina.   Painful urination.  Bleeding after sexual intercourse.  In males, symptoms include:  Burning with urination.  Pain in the testicles.  Watery mucous-like discharge from the penis.  It can cause longstanding (chronic) pelvic pain after frequent infections.  TREATMENT  PID can cause women to not be able to have children (sterile) if left untreated or if half-treated.  It is important to finish ALL medications given to you.  This is a sexually transmitted infection. So you are also at risk for other sexually transmitted diseases, including HIV (AIDS), it is recommended that you get tested. HOME CARE INSTRUCTIONS  Warning: This infection is contagious. Do not have sex until treatment is completed.  Follow up at your caregiver's office or the clinic to which you were referred. If your diagnosis (learning what is wrong) is confirmed by culture or some other method, your recent sexual contacts need treatment. Even if they are symptom free or have a negative culture or evaluation, they should be treated.  PREVENTION  Women should use sanitary pads instead of tampons for vaginal discharge.  Wipe front to back after using the toilet and avoid douching.   Practice safe sex, use condoms, have only one sex partner and be sure your sex partner is not having sex with others.  Ask your caregiver to test you for chlamydia at your regular checkups or sooner if you are having symptoms.  Ask for further information if you are pregnant.  SEEK IMMEDIATE MEDICAL CARE IF:  You develop an oral temperature above 102 F (38.9 C), not controlled by medications or lasting more than 2 days.  You develop an increase in pain.  You develop any type of abnormal discharge.  You develop vaginal bleeding and it is not time for your period.  You develop painful intercourse.   Bacterial Vaginosis  Bacterial vaginosis (BV) is a vaginal infection where the normal balance of bacteria in the vagina is disrupted. This is not a sexually transmitted disease and your sexual partners do NOT need to be treated. CAUSES  The cause of BV is not fully understood. BV develops when there is an increase or imbalance of harmful bacteria.  Some activities or behaviors can upset the normal balance of bacteria in the vagina and put women at increased risk including:  Having a new sex partner or multiple sex partners.  Douching.  Using an intrauterine device (IUD) for contraception.  It is not clear what role sexual activity plays in the development of BV. However, women that have never had sexual intercourse are rarely infected with BV.  Women do not get BV from toilet seats, bedding, swimming pools or from touching objects around them.    SYMPTOMS  Grey vaginal discharge.  A fish-like odor with discharge, especially after sexual intercourse.  Itching or burning of the vagina and vulva.  Burning or pain with urination.  Some women have no signs or symptoms at all.   TREATMENT  Sometimes BV will clear up without treatment.  BV may be treated with antibiotics.  BV can recur after treatment. If this happens, a second round of antibiotics will often be prescribed.  HOME CARE INSTRUCTIONS  Finish all medication as directed by your caregiver.  Do not have sex until treatment is completed.  Do NOT drink any alcoholic beverages while being treated  with Metronidazole (Flagyl). This will cause a severe reaction inducing vomiting.  RESOURCE GUIDE  Dental Problems  Patients with Medicaid: [facility]                     [facility] [phone]                                           [phone] Phone:  [phone]                                                  Phone:  [phone]  If unable to pay or uninsured, contact:  [facility] or [facility] to become qualified for the adult dental clinic.  Chronic Pain Problems Contact [facility]  [phone] Patients need to be referred by their primary care doctor.  Insufficient Money for Medicine Contact United Way:  call "211" or [facility] [phone].  No Primary Care Doctor Call [facility]  [phone] Other agencies that provide inexpensive medical care    [facility]  [phone]    [facility]  [phone]    [facility]  [phone]    [facility]  [phone]    [facility]  [phone]    [facility]  [phone]  Psychological Services [facility]  [phone] [facility]  [phone] [facility]   [phone] (emergency services [phone]  Substance Abuse Resources [facility]   [phone] [facility] [phone] [facility] [phone] [facility] [phone] Residential & Outpatient Substance Abuse Program  [phone]  Abuse/Neglect [facility] Hotline [phone] [phone] [facility] Hotline [phone] (After Hours)  Emergency Shelter [facility] [phone]  [facility] [phone] [facility] [phone]  MRSA Hotline #:   225-282-1353706-425-8434    [facility]  [facility]     [employer]                          [facility] [address] [place]  [address]      [address]  [place]                                                [place]                            [place] Phone:  [phone]                                   Phone:  [phone]                 Phone:  [phone]  [facility] Phone:  [phone]  [facility] Hotline [phone] (After Hours)

## 2014-05-21 NOTE — ED Notes (Addendum)
Pt transported from a home with c/o feeling a "pop" @ 20 min ago. Pt was seen for same 12/11. Pt did have pos GC swab.  Pt c/o severe lower abd pain, pt states she was unaware of pos cultures. Pt also reports itching, burning and pain to vagina.

## 2014-05-21 NOTE — ED Notes (Signed)
Pt states she is unable to urinate at this time.

## 2014-05-31 ENCOUNTER — Telehealth (HOSPITAL_COMMUNITY): Payer: Self-pay

## 2014-05-31 NOTE — ED Notes (Signed)
Unable to contact pt by mail or telephone. Unable to communicate lab results or treatment changes. 

## 2014-11-19 IMAGING — US US OB FOLLOW-UP
1 series · 12 of 28 positions shown · non-contrast
Comparison: none

[Series 1: us ob follow-up · 0.26mm/px · 12 of 40 slices shown]
[im 2/40]
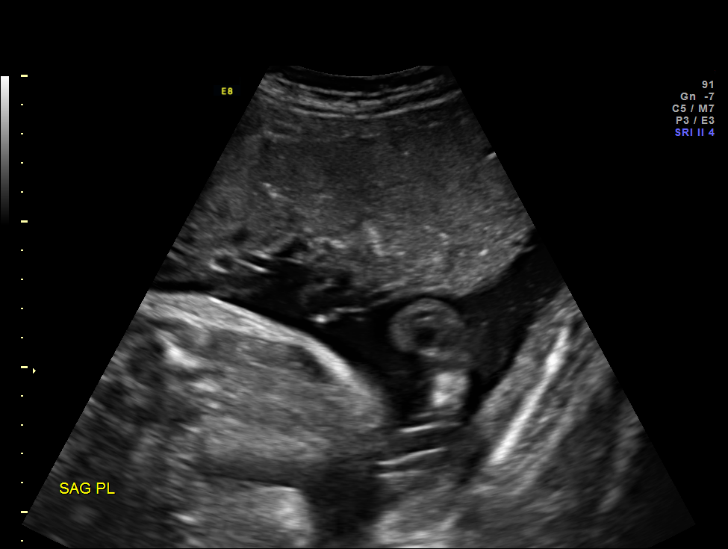
[im 5/40]
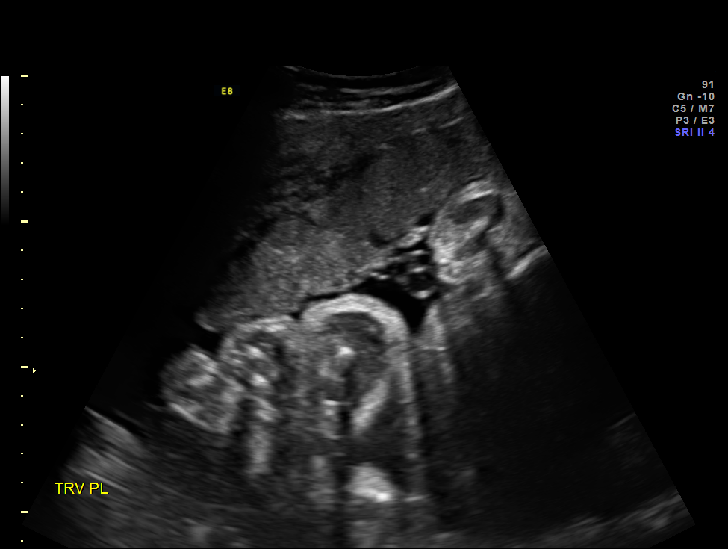
[im 8/40]
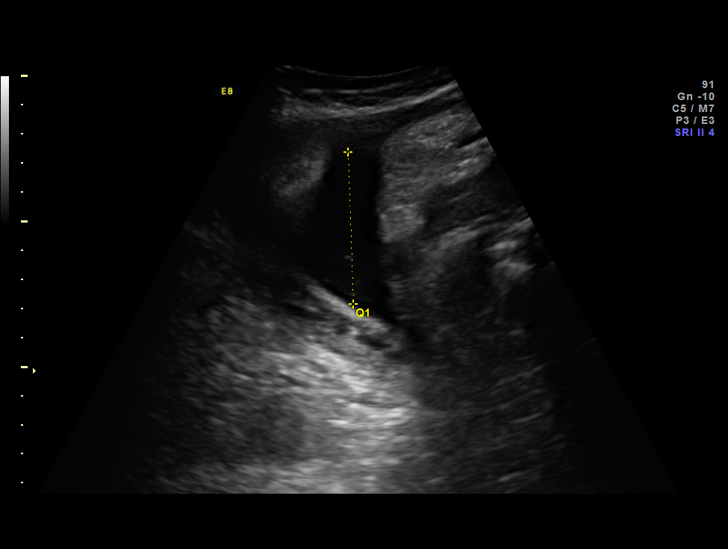
[im 12/40]
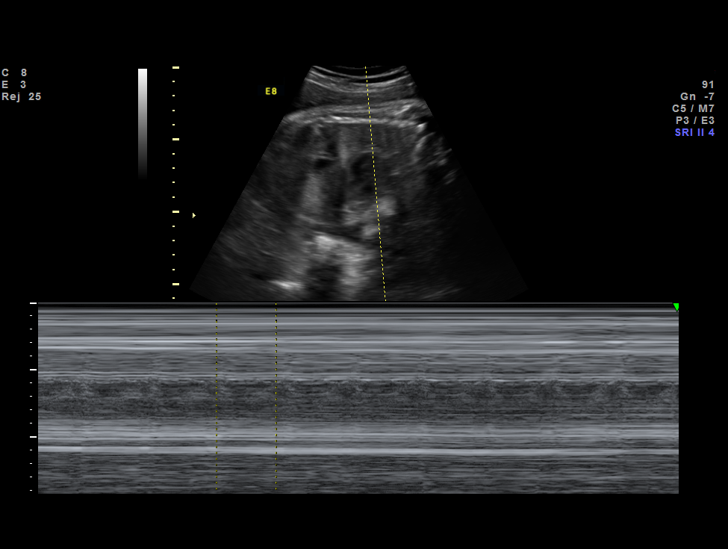
[im 15/40]
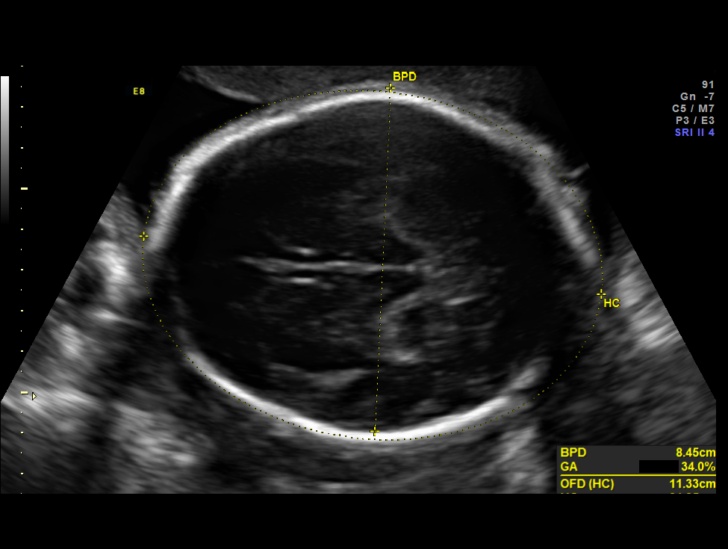
[im 18/40]
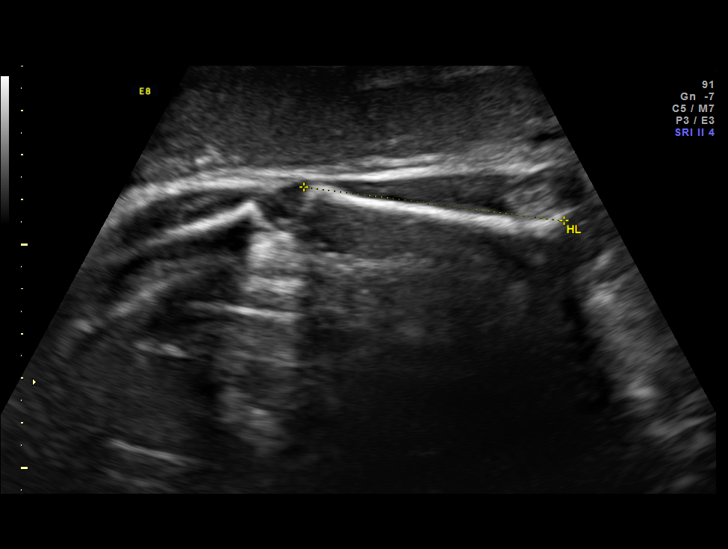
[im 22/40]
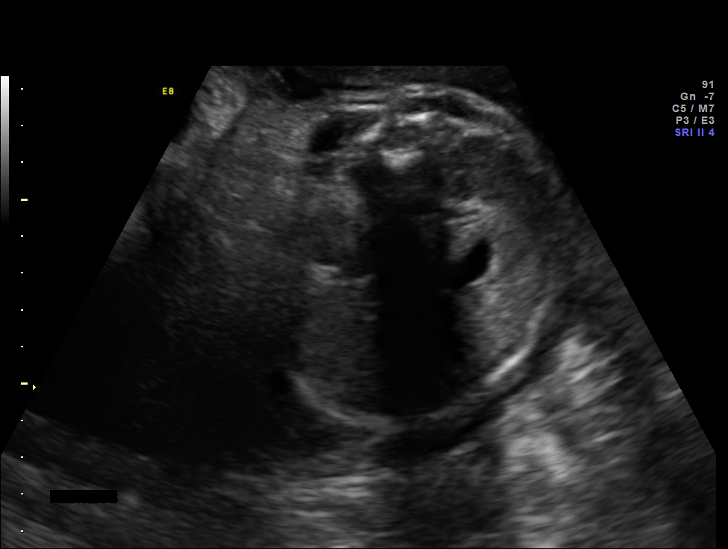
[im 25/40]
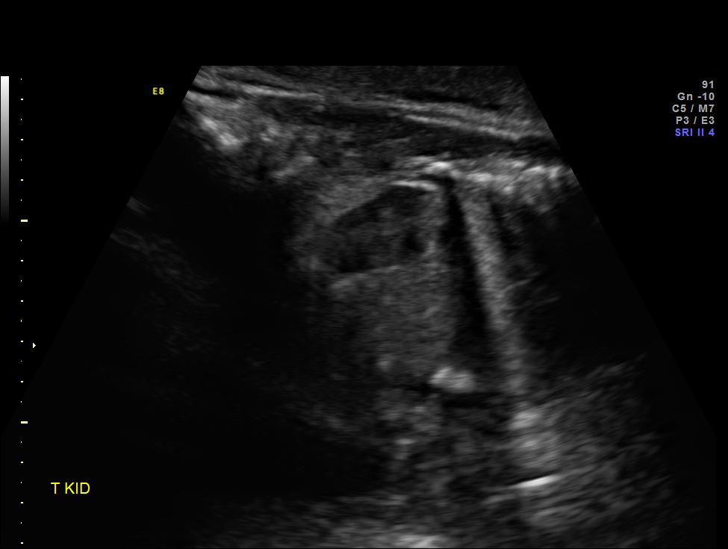
[im 28/40]
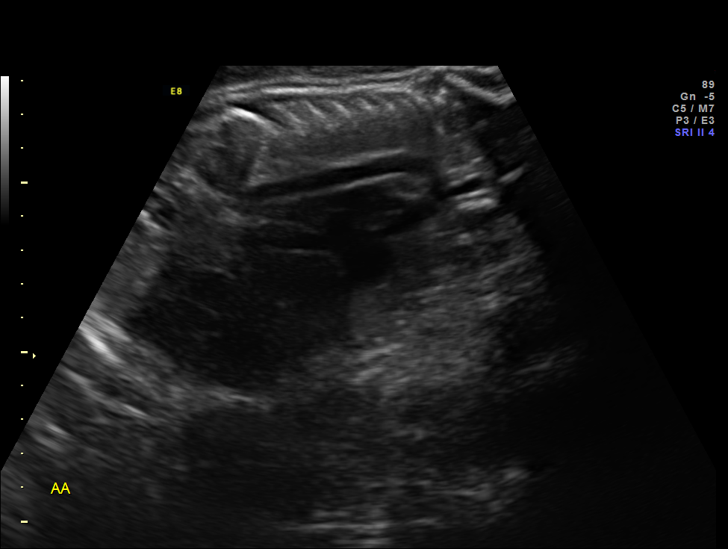
[im 32/40]
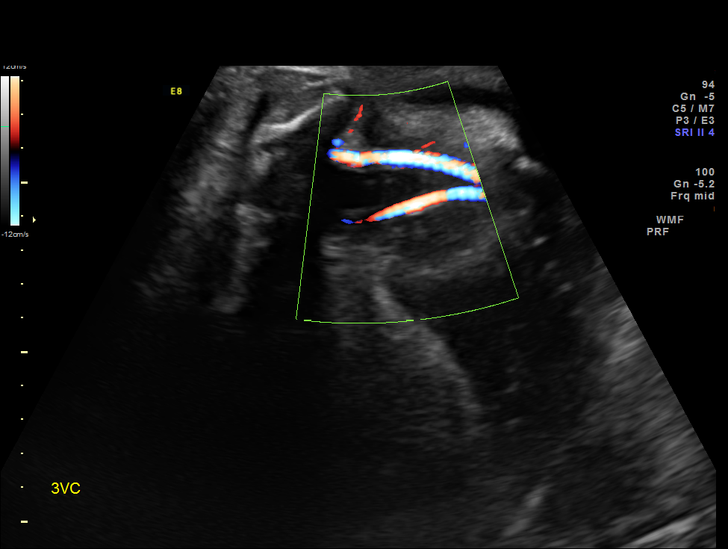
[im 35/40]
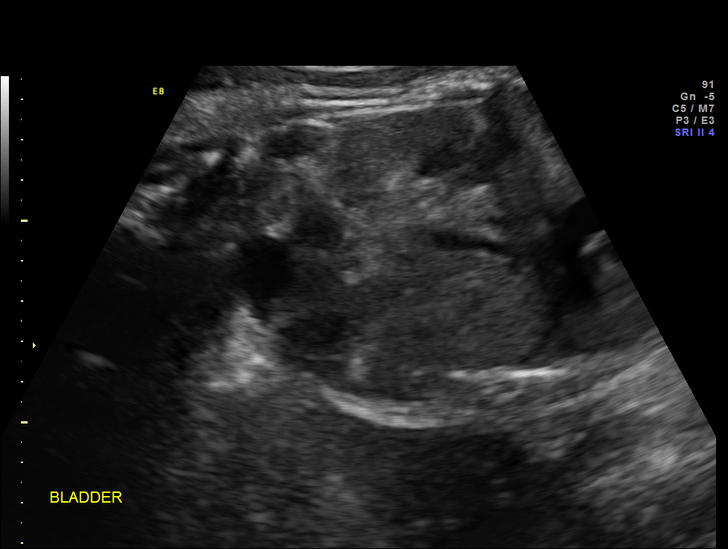
[im 38/40]
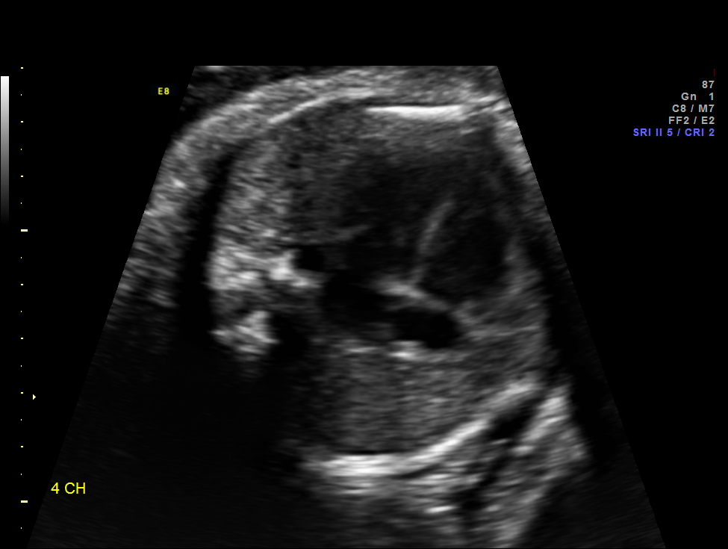

[12 of 28 positions shown; findings below may reference images not displayed]

OBSTETRICS REPORT
                      (Signed Final 11/12/2013 [DATE])

Service(s) Provided

 US OB FOLLOW UP                                       76816.1
Indications

 Follow-up incomplete fetal anatomic evaluation
Fetal Evaluation

 Num Of Fetuses:    1
 Fetal Heart Rate:  169                          bpm
 Cardiac Activity:  Observed
 Presentation:      Cephalic
 Placenta:          Anterior, above cervical os
 P. Cord            Previously Visualized
 Insertion:

 Amniotic Fluid
 AFI FV:      Subjectively within normal limits
 AFI Sum:     15.46   cm       56  %Tile     Larg Pckt:    5.23  cm
 RUQ:   5.23    cm   RLQ:    3.16   cm    LUQ:   2.46    cm   LLQ:    4.61   cm
Biometry

 BPD:     84.6  mm     G. Age:  34w 0d                CI:         75.5   70 - 86
 OFD:      112  mm                                    FL/HC:      20.2   20.1 -

 HC:     313.3  mm     G. Age:  35w 1d       28  %    HC/AC:      1.05   0.93 -

 AC:     297.1  mm     G. Age:  33w 5d       30  %    FL/BPD:     74.9   71 - 87
 FL:      63.4  mm     G. Age:  32w 6d        8  %    FL/AC:      21.3   20 - 24
 HUM:     57.6  mm     G. Age:  33w 3d       39  %

 Est. FW:    7781  gm    4 lb 15 oz      41  %
Gestational Age

 LMP:           34w 0d        Date:  03/19/13                 EDD:   12/24/13
 U/S Today:     33w 6d                                        EDD:   12/25/13
 Best:          34w 4d     Det. By:  Early Ultrasound         EDD:   12/20/13
Anatomy

 Cranium:          Appears normal         Aortic Arch:      Appears normal
 Fetal Cavum:      Appears normal         Ductal Arch:      Not well visualized
 Ventricles:       Appears normal         Diaphragm:        Appears normal
 Choroid Plexus:   Previously seen        Stomach:          Appears normal, left
                                                            sided
 Cerebellum:       Previously seen        Abdomen:          Appears normal
 Posterior Fossa:  Not well visualized    Abdominal Wall:   Appears nml (cord
                                                            insert, abd wall)
 Nuchal Fold:      Not well visualized    Cord Vessels:     Appears normal (3
                                                            vessel cord)
 Face:             Not well visualized    Kidneys:          Appear normal
 Lips:             Not well visualized    Bladder:          Appears normal
 Heart:            Appears normal         Spine:            Previously seen
                   (4CH, axis, and
                   situs)
 RVOT:             Previously seen        Lower             Previously seen
                                          Extremities:
 LVOT:             Previously seen        Upper             Previously seen
                                          Extremities:

 Other:  Female gender. Heels previously seen.
Cervix Uterus Adnexa

 Cervix:       Not visualized (advanced GA >01wks)
Comments

 The patient's fetal anatomic survey is not complete due to
 maternal body habitus and fetal position.  However, no gross
 fetal anomalies were identified.  At this gestational age it is
 unlikely that further ultrasounds will allow for the completion
 of the fetal anatomic survey.  Thus, no further ultrasounds are
 required unless additional problems arise.
Impression

 Single living intrauterine pregnancy at 34 weeks 4 days.
 Appropriate interval fetal growth (41%).
 Normal amniotic fluid volume.
 Normal interval fetal anatomy.
Recommendations

 Follow-up ultrasounds as clinically indicated.

 questions or concerns.
                Holt, Azaria

## 2016-04-18 ENCOUNTER — Inpatient Hospital Stay
Admit: 2016-04-18 | Discharge: 2016-04-18 | Disposition: A | Discharge status: Home / Self Care | Payer: BLUE CROSS/BLUE SHIELD | Attending: Emergency Medicine

## 2016-04-18 DIAGNOSIS — N760 Acute vaginitis: Secondary | ICD-10-CM

## 2016-04-18 LAB — URINALYSIS W/ RFLX MICROSCOPIC
Bilirubin: NEGATIVE
Blood: NEGATIVE
Glucose: NEGATIVE mg/dL
Ketone: NEGATIVE mg/dL
Nitrites: NEGATIVE
Protein: NEGATIVE mg/dL
Specific gravity: 1.023 (ref 1.005–1.030)
Urobilinogen: 1 EU/dL (ref 0.2–1.0)
pH (UA): 8.5 — ABNORMAL HIGH (ref 5.0–8.0)

## 2016-04-18 LAB — WET PREP
Wet prep: NONE SEEN
Wet prep: NONE SEEN

## 2016-04-18 LAB — URINE MICROSCOPIC ONLY
RBC: 0 /hpf (ref 0–5)
WBC: 25 /hpf (ref 0–4)

## 2016-04-18 LAB — HCG URINE, QL: HCG urine, QL: NEGATIVE

## 2016-04-18 MED ORDER — NITROFURANTOIN (25% MACROCRYSTAL FORM) 100 MG CAP
100 mg | ORAL_CAPSULE | Freq: Two times a day (BID) | ORAL | 0 refills | Status: AC
Start: 2016-04-18 — End: 2016-04-25

## 2016-04-18 MED ORDER — CEFTRIAXONE 250 MG SOLUTION FOR INJECTION
250 mg | Freq: Once | INTRAMUSCULAR | Status: AC
Start: 2016-04-18 — End: 2016-04-18
  Administered 2016-04-18: 21:00:00 via INTRAMUSCULAR

## 2016-04-18 MED ORDER — AZITHROMYCIN 250 MG TAB
250 mg | ORAL | Status: AC
Start: 2016-04-18 — End: 2016-04-18
  Administered 2016-04-18: 21:00:00 via ORAL

## 2016-04-18 MED ORDER — METRONIDAZOLE 500 MG TAB
500 mg | ORAL_TABLET | Freq: Two times a day (BID) | ORAL | 0 refills | Status: AC
Start: 2016-04-18 — End: 2016-04-25

## 2016-04-18 MED FILL — AZITHROMYCIN 250 MG TAB: 250 mg | ORAL | Qty: 4

## 2016-04-18 MED FILL — CEFTRIAXONE 250 MG SOLUTION FOR INJECTION: 250 mg | INTRAMUSCULAR | Qty: 250

## 2016-04-18 NOTE — ED Provider Notes (Addendum)
HPI Comments: Marissa LatheFelicia Kirby is a 25 year old female arrived to ER c/o vaginal d/c and intermittent mild pelvic cramping x2 days. Patient reports her boyfriend called her today and requesting she get treated for STD's.  Patient states, "he didn't tell me which one he had."  Patient reports within the past six months she has one partner.  Denies fever, chills, dyspareunia, vaginal swelling, vaginal lesions, irregular menses.  LMP 04/05/2016.  G2 P2 AB0.  Patient reports she has been tolerating po fluids and solids well. Denies headache, fatigue, CP,SOB, abdominal pain, N/V/D, flank pain, back pain, urinary sx's, or any other concerns.     Patient is a 25 y.o. female presenting with vaginal discharge and pelvic pain. The history is provided by the patient.   Vaginal Discharge    Pertinent negatives include no abdominal pain, no constipation, no diarrhea, no nausea, no vomiting, no dysuria and no frequency.   Pelvic Pain    Pertinent negatives include no diarrhea, no nausea, no vomiting, no constipation, no dysuria, no frequency, no hematuria and no chest pain.        History reviewed. No pertinent past medical history.    History reviewed. No pertinent surgical history.      History reviewed. No pertinent family history.    Social History     Social History   ??? Marital status: N/A     Spouse name: N/A   ??? Number of children: N/A   ??? Years of education: N/A     Occupational History   ??? Not on file.     Social History Main Topics   ??? Smoking status: Never Smoker   ??? Smokeless tobacco: Never Used   ??? Alcohol use Yes   ??? Drug use: No   ??? Sexual activity: Not on file     Other Topics Concern   ??? Not on file     Social History Narrative   ??? No narrative on file         ALLERGIES: Review of patient's allergies indicates no known allergies.    Review of Systems   Constitutional: Negative.    HENT: Negative.    Eyes: Negative.    Respiratory: Negative.  Negative for cough and shortness of breath.     Cardiovascular: Negative.  Negative for chest pain.   Gastrointestinal: Negative.  Negative for abdominal distention, abdominal pain, blood in stool, constipation, diarrhea, nausea and vomiting.   Genitourinary: Positive for pelvic pain and vaginal discharge. Negative for difficulty urinating, dysuria, flank pain, frequency, genital sores, hematuria, menstrual problem and vaginal pain.   Musculoskeletal: Negative.    Skin: Negative.    Neurological: Negative.        Vitals:    04/18/16 1416   BP: 122/57   Pulse: 66   Resp: 18   Temp: 98 ??F (36.7 ??C)   SpO2: 100%   Weight: 68 kg (150 lb)   Height: 5\' 6"  (1.676 m)            Physical Exam   Constitutional: She is oriented to person, place, and time. She appears well-developed and well-nourished. No distress.   HENT:   Right Ear: Hearing, tympanic membrane, external ear and ear canal normal.   Left Ear: Hearing, tympanic membrane, external ear and ear canal normal.   Nose: Nose normal.   Mouth/Throat: Uvula is midline, oropharynx is clear and moist and mucous membranes are normal. No oropharyngeal exudate.   Cardiovascular: Normal rate, regular rhythm and  normal heart sounds.  Exam reveals no gallop and no friction rub.    No murmur heard.  Pulmonary/Chest: Effort normal and breath sounds normal. No respiratory distress. She has no wheezes. She has no rales. She exhibits no tenderness.   Abdominal: Soft. Normal appearance and bowel sounds are normal. She exhibits no distension and no mass. There is no tenderness. There is no rigidity, no rebound, no guarding, no CVA tenderness, no tenderness at McBurney's point and negative Murphy's sign.   Genitourinary: Pelvic exam was performed with patient in the knee-chest position. No labial fusion. There is no rash, tenderness, lesion or injury on the right labia. There is no rash, tenderness, lesion or injury on the left labia. Cervix exhibits no motion tenderness and no friability. Right  adnexum displays no mass, no tenderness and no fullness. Left adnexum displays tenderness. Left adnexum displays no mass and no fullness. Vaginal discharge (small amount of white, vaginal d/c) found.   Genitourinary Comments: Adria (tech) at bedside for pelvic examination   Musculoskeletal: Normal range of motion.   Neurological: She is alert and oriented to person, place, and time.   Skin: Skin is warm and dry. She is not diaphoretic.   Psychiatric: She has a normal mood and affect. Her behavior is normal. Judgment and thought content normal.   Nursing note and vitals reviewed.       MDM  Number of Diagnoses or Management Options  Acute cystitis without hematuria:   BV (bacterial vaginosis):   Diagnosis management comments: DDx:  BV  Vaginal Yeast Infection  PID  Ovarian Cyst  Ovarian Torsion  Appendicitis    Clinical Impression/plan: Patient afebrile, non tachycardic and stable condition.  Reviewed diagnostic results with patient. Answered questions.  Patient requesting to be treated for STI's.  Will empirically treat for G/C pending final cultures. Will prescribe Flagyl and Macrobid.  Follow up with OBGYN and PCP in 2-4 days. If symptoms worsen or have any other concerns, return to ER. Patient verbalizes d/c instructions.        Amount and/or Complexity of Data Reviewed  Clinical lab tests: ordered and reviewed  Review and summarize past medical records: yes    Risk of Complications, Morbidity, and/or Mortality  Presenting problems: low  Diagnostic procedures: low      ED Course       Procedures    Labs Reviewed   URINALYSIS W/ RFLX MICROSCOPIC - Abnormal; Notable for the following:        Result Value    pH (UA) 8.5 (*)     Leukocyte Esterase MODERATE (*)     All other components within normal limits   URINE MICROSCOPIC ONLY - Abnormal; Notable for the following:     Bacteria 2+ (*)     All other components within normal limits   WET PREP   HCG URINE, QL   CHLAMYDIA/NEISSERIA AMPLIFICATION     Diagnosis:    1. BV (bacterial vaginosis)    2. Acute cystitis without hematuria          Disposition: Home    Follow-up Information     Follow up With Details Comments Contact Info    [facility] Schedule an appointment as soon as possible for a visit in 2 days  [address]  [address]  [place] [address]  [phone]    [facility] Department of OB/GYN Schedule an appointment as soon as possible for a visit in 2 days  [address],  [address]  [place] [id number]  [phone]    Return to ER immediately for any worsening symptoms or concerns.              Patient's Medications   Start Taking    METRONIDAZOLE (FLAGYL) 500 MG TABLET    Take 1 Tab by mouth two (2) times a day for 7 days.    NITROFURANTOIN, MACROCRYSTAL-MONOHYDRATE, (MACROBID) 100 MG CAPSULE    Take 1 Cap by mouth two (2) times a day for 7 days.   Continue Taking    No medications on file   These Medications have changed    No medications on file   Stop Taking    No medications on file

## 2016-04-18 NOTE — ED Notes (Signed)
Pt discharged to home ambulatory and in company of family  Discharge instructions provided via discussion and handout.   Teaching to patient   Verbalized understanding.   No questions voiced.   Discharged with 2 RX.

## 2016-04-18 NOTE — ED Triage Notes (Signed)
Patient with pelvic pain, vaginal discharge, and exposed to STD

## 2016-04-20 LAB — CHLAMYDIA/NEISSERIA AMPLIFICATION
Chlamydia amplification: NEGATIVE
N. gonorrhoeae amplification: NEGATIVE

## 2016-11-20 ENCOUNTER — Inpatient Hospital Stay
Admit: 2016-11-20 | Discharge: 2016-11-20 | Disposition: A | Discharge status: Home / Self Care | Payer: MEDICAID | Attending: Emergency Medicine

## 2016-11-20 DIAGNOSIS — N3000 Acute cystitis without hematuria: Secondary | ICD-10-CM

## 2016-11-20 LAB — URINALYSIS W/ RFLX MICROSCOPIC
Bilirubin: NEGATIVE
Blood: NEGATIVE
Glucose: NEGATIVE mg/dL
Ketone: NEGATIVE mg/dL
Nitrites: NEGATIVE
Protein: NEGATIVE mg/dL
Specific gravity: 1.025 (ref 1.005–1.030)
Urobilinogen: 1 EU/dL (ref 0.2–1.0)
pH (UA): 6.5 (ref 5.0–8.0)

## 2016-11-20 LAB — HCG URINE, QL: HCG urine, QL: NEGATIVE

## 2016-11-20 LAB — WET PREP
Wet prep: ABSENT
Wet prep: NONE SEEN

## 2016-11-20 LAB — URINE MICROSCOPIC ONLY
RBC: 2 /hpf (ref 0–5)
WBC: 11 /hpf (ref 0–4)

## 2016-11-20 MED ORDER — METRONIDAZOLE 250 MG TAB
250 mg | ORAL | Status: AC
Start: 2016-11-20 — End: 2016-11-20
  Administered 2016-11-20: 19:00:00 via ORAL

## 2016-11-20 MED ORDER — LIDOCAINE (PF) 10 MG/ML (1 %) IJ SOLN
10 mg/mL (1 %) | INTRAMUSCULAR | Status: AC
Start: 2016-11-20 — End: 2016-11-20
  Administered 2016-11-20: 19:00:00 via INTRAMUSCULAR

## 2016-11-20 MED ORDER — NITROFURANTOIN (25% MACROCRYSTAL FORM) 100 MG CAP
100 mg | ORAL_CAPSULE | Freq: Two times a day (BID) | ORAL | 0 refills | Status: AC
Start: 2016-11-20 — End: 2016-11-27

## 2016-11-20 MED ORDER — ONDANSETRON 4 MG TAB, RAPID DISSOLVE
4 mg | ORAL | Status: AC
Start: 2016-11-20 — End: 2016-11-20
  Administered 2016-11-20: 19:00:00 via ORAL

## 2016-11-20 MED ORDER — AZITHROMYCIN 250 MG TAB
250 mg | ORAL | Status: AC
Start: 2016-11-20 — End: 2016-11-20
  Administered 2016-11-20: 19:00:00 via ORAL

## 2016-11-20 MED FILL — METRONIDAZOLE 250 MG TAB: 250 mg | ORAL | Qty: 8

## 2016-11-20 MED FILL — ONDANSETRON 4 MG TAB, RAPID DISSOLVE: 4 mg | ORAL | Qty: 1

## 2016-11-20 MED FILL — CEFTRIAXONE 250 MG SOLUTION FOR INJECTION: 250 mg | INTRAMUSCULAR | Qty: 250

## 2016-11-20 MED FILL — AZITHROMYCIN 250 MG TAB: 250 mg | ORAL | Qty: 4

## 2016-11-20 NOTE — ED Notes (Signed)
I have reviewed discharge instructions with the patient.  The patient verbalized understanding.

## 2016-11-20 NOTE — ED Triage Notes (Signed)
Onset Sat

## 2016-11-20 NOTE — ED Provider Notes (Addendum)
EMERGENCY DEPARTMENT HISTORY AND PHYSICAL EXAM    12:29 PM      Date: 11/20/2016  Patient Name: Marissa Kirby    History of Presenting Illness     Chief Complaint   Patient presents with   ??? Urinary Pain   ??? Pelvic Pain       History Provided By: Patient    Chief Complaint: pelvic pain, missed period, vaginal discharge  Duration:  Days  Timing:  Acute  Location: GU  Quality: Aching  Severity: Moderate  Modifying Factors: none  Associated Symptoms: denies any other associated signs or symptoms      Additional History (Context):Marissa Kirby is a 26 y.o. female who presents to the emergency department for evaluation of missed period this month, vaginal discharge, and pelvic pain x 2 days.  Pt reports she took 2 HPTs, but the results were not clear.  She is G2P2.  No complications with prior pregnancies.  Vaginal discharge is worse when she sneezes.  Pt denies any fevers or chills, headache, dizziness or light headedness, ENT issues, CP or discomfort, SOB, cough, n/v/d/c, back pain, diaphoresis, melena/hematochezia, dysuria, hematuria, frequency, focal weakness/numbness/tingling, or rash.  Patient has no other complaints at this time.    PCP:  Not On File [facility]      Past History     Past Medical History:  History reviewed. No pertinent past medical history.    Past Surgical History:  History reviewed. No pertinent surgical history.    Family History:  History reviewed. No pertinent family history.    Social History:  Social History   Substance Use Topics   ??? Smoking status: Never Smoker   ??? Smokeless tobacco: Never Used   ??? Alcohol use Yes      Comment: rarely       Allergies:  No Known Allergies      Review of Systems       Review of Systems   Constitutional: Negative for chills and fever.   HENT: Negative for congestion, rhinorrhea and sore throat.    Respiratory: Negative for cough and shortness of breath.    Cardiovascular: Negative for chest pain.    Gastrointestinal: Positive for abdominal pain. Negative for blood in stool, constipation, diarrhea, nausea and vomiting.   Genitourinary: Positive for menstrual problem, pelvic pain and vaginal discharge. Negative for dysuria, frequency and hematuria.   Musculoskeletal: Negative for back pain and myalgias.   Skin: Negative for rash and wound.   Neurological: Negative for dizziness and headaches.   All other systems reviewed and are negative.        Physical Exam     Visit Vitals   ??? BP 124/85 (BP 1 Location: Right arm, BP Patient Position: At rest;Sitting)   ??? Pulse 64   ??? Temp 98.2 ??F (36.8 ??C)   ??? Resp 16   ??? Ht 5\' 4"  (1.626 m)   ??? Wt 77.2 kg (170 lb 3 oz)   ??? LMP 10/16/2016   ??? SpO2 100%   ??? BMI 29.21 kg/m2         Physical Exam   Constitutional: She is oriented to person, place, and time. She appears well-developed and well-nourished. No distress.   HENT:   Head: Normocephalic and atraumatic.   Eyes: Conjunctivae are normal.   Neck: Normal range of motion. Neck supple.   Cardiovascular: Normal rate, regular rhythm and normal heart sounds.    Pulmonary/Chest: Effort normal and breath sounds normal. No respiratory distress. She exhibits  no tenderness.   Abdominal: Soft. Bowel sounds are normal. She exhibits no distension. There is tenderness. There is no rebound and no guarding.   Tenderness to deep palpation noted to suprapubic region.  No rebound, rigidity, guarding, distention, or mass noted.  (-) Murphy's sign.  (-) Rovsing's sign. Normoactive BS all four quadrants.     Genitourinary:   Genitourinary Comments: Pelvic Exam:    Speculum and bimanual pelvic exam performed at bedside with chaperone assistance of Adria, ED Tech. Exam of external labia majora and minora without apparent rash or lesions.  Vaginal discharge was thin, white.  There was no vaginal bleeding.  Cervical os was closed.  Uterus size wnl.  No adnexal masses or tenderness appreciated.  Patient tolerated procedure  well.  No CMT. Specimens obtained and labeled at bedside to be sent to lab.      Musculoskeletal: She exhibits no edema, tenderness or deformity.   Neurological: She is alert and oriented to person, place, and time. She has normal reflexes.   Skin: Skin is warm and dry. She is not diaphoretic.   Psychiatric: She has a normal mood and affect.   Nursing note and vitals reviewed.        Diagnostic Study Results     Labs -  Recent Results (from the past 12 hour(s))   URINALYSIS W/ RFLX MICROSCOPIC    Collection Time: 11/20/16 11:50 AM   Result Value Ref Range    Color YELLOW      Appearance CLEAR      Specific gravity 1.025 1.005 - 1.030      pH (UA) 6.5 5.0 - 8.0      Protein NEGATIVE  NEG mg/dL    Glucose NEGATIVE  NEG mg/dL    Ketone NEGATIVE  NEG mg/dL    Bilirubin NEGATIVE  NEG      Blood NEGATIVE  NEG      Urobilinogen 1.0 0.2 - 1.0 EU/dL    Nitrites NEGATIVE  NEG      Leukocyte Esterase MODERATE (A) NEG     HCG URINE, QL    Collection Time: 11/20/16 11:50 AM   Result Value Ref Range    HCG urine, QL NEGATIVE  NEG     URINE MICROSCOPIC ONLY    Collection Time: 11/20/16 11:50 AM   Result Value Ref Range    WBC 11 to 20 0 - 4 /hpf    RBC 2 to 4 0 - 5 /hpf    Epithelial cells 2+ 0 - 5 /lpf    Bacteria 1+ (A) NEG /hpf    Mucus 2+ (A) NEG /lpf   WET PREP    Collection Time: 11/20/16 12:40 PM   Result Value Ref Range    Special Requests: NO SPECIAL REQUESTS      Wet prep MOTILE TRICHOMONAS NOTED  FEW        Wet prep NO YEAST SEEN      Wet prep CLUE CELLS ABSENT         Radiologic Studies - none  No results found.      Medical Decision Making   I am the first provider for this patient.    I reviewed the vital signs, available nursing notes, past medical history, past surgical history, family history and social history.    Vital Signs-Reviewed the patient's vital signs.    Pulse Oximetry Analysis -  100% on room air (Interpretation)    Records Reviewed: Nursing Notes and Old Medical Records (  Time of Review: 12:29 PM)     ED Course: Progress Notes, Reevaluation, and Consults:  0152 PM:  Pt care transferred to Charlott Rakes, ED provider. History of patient complaint(s), available diagnostic reports and current treatment plan has been discussed thoroughly.   Bedside rounding on patient occured : yes .  Intended disposition of patient : Home  Pending diagnostics reports and/or labs (please list): pending wet prep.  Charlott Rakes, NP     1345 PM:  Received report from Neeses B. PA.  I have re-examined patient at this time.  Patient denies pain discomfort.  Waiting for wet prep result.    14:28 PM:  Patient stable condition.  I have reviewed diagnostic results with patient. Answered questions. Will empirically treat for G/C.  Will prescribe Macrobid.  Avoid sexual contact until cleared by OBGYN.  Contact all partners within the past six months to have them evaluated and treated for STI's.  Follow up with PCP in 2-4 days.  If symptoms worsen or have any other concerns, return to ER.  Patient verbalizes d/c instructions.       Provider Notes (Medical Decision Making):   Differential Diagnosis: appendicitis, ectopic pregnancy, normal pregnancy, TOA, PID, ovarian cyst, endometriosis, endometritis, uterine fibroids, constipation, gastroenteritis, IBS, IBD, celiac disease, diverticulitis, nephrolithiasis, pyelonephritis, cystitis, mesenteric ischemia, mesenteric adenitis, ruptured AAA, SBO, LBO    Plan:  Pt presents ambulatory in NAD, well-hydrated, non-toxic in appearance, with normal vitals.  Benign exam of abdomen with TTP to suprapubic region and no peritoneal signs.  UA with infection.  Wet prep pending at time of turnover of care to [employer], NP.  Charlott Rakes, NP      Diagnosis     Clinical Impression:   1. Acute cystitis without hematuria    2. Trichomonas infection    3. STD (sexually transmitted disease)        Disposition: Home    Follow-up Information     Follow up With Details Comments Contact Info     [facility] Schedule an appointment as soon as possible for a visit in 2 days  [address]  [address]  [place] [address]  [phone]    Read Drivers, MD Schedule an appointment as soon as possible for a visit in 2 days  [address] [facility]  [address] [place] [address]  [phone]      Return to ER immediately for any worsening symptoms or concerns              Patient's Medications   Start Taking    NITROFURANTOIN, MACROCRYSTAL-MONOHYDRATE, (MACROBID) 100 MG CAPSULE    Take 1 Cap by mouth two (2) times a day for 7 days.   Continue Taking    No medications on file   These Medications have changed    No medications on file   Stop Taking    No medications on file     _______________________________

## 2016-11-21 LAB — CHLAMYDIA/NEISSERIA AMPLIFICATION
Chlamydia amplification: NEGATIVE
N. gonorrhoeae amplification: NEGATIVE

## 2017-02-05 ENCOUNTER — Inpatient Hospital Stay
Admit: 2017-02-05 | Discharge: 2017-02-05 | Disposition: A | Discharge status: Home / Self Care | Payer: MEDICAID | Attending: Emergency Medicine

## 2017-02-05 DIAGNOSIS — H9202 Otalgia, left ear: Secondary | ICD-10-CM

## 2017-02-05 MED ORDER — ACETAMINOPHEN 325 MG TABLET
325 mg | ORAL_TABLET | ORAL | 0 refills | Status: DC | PRN
Start: 2017-02-05 — End: 2020-08-16

## 2017-02-05 MED ORDER — NEOMYCIN-COLIST-HC-THONZONIUM 3.3 MG-3 MG-10 MG-0.5 MG/ML EAR DROP
Freq: Four times a day (QID) | OTIC | 0 refills | Status: AC
Start: 2017-02-05 — End: 2017-02-12

## 2017-02-05 NOTE — ED Provider Notes (Signed)
EMERGENCY DEPARTMENT HISTORY AND PHYSICAL EXAM    Date: 02/05/2017  Patient Name: Marissa Kirby    History of Presenting Illness     Chief Complaint   Patient presents with   ??? Ear Pain         History Provided By: Patient    Chief Complaint: Ear Pain   Duration: 1 day ago   Timing:  Constant  Location: Left ear   Quality: Aching  Severity: 7 out of 10  Modifying Factors: None   Associated Symptoms:  Associated sxs include left pharyngeal pain, and left-sided neck pain. Pt denies a dental problem, fever, and any other sxs or complaints.    Additional History (Context):   Marissa Kirby is a 26 y.o. female with no significant PMHx who presents to the emergency department C/O left ear pain onset 1 day ago. Associated sxs include left pharyngeal pain, and left-sided neck pain. Pt denies a dental problem, fever, and any other sxs or complaints. Reports LNMP: 01/16/17. Denies tobacco and EtOH use. No other concerns were expressed at this time.     PCP: PROVIDER UNKNOWN        Past History     Past Medical History:  History reviewed. No pertinent past medical history.    Past Surgical History:  History reviewed. No pertinent surgical history.    Family History:  History reviewed. No pertinent family history.    Social History:  Social History   Substance Use Topics   ??? Smoking status: Never Smoker   ??? Smokeless tobacco: Never Used   ??? Alcohol use Yes      Comment: rarely       Allergies:  No Known Allergies      Review of Systems   Review of Systems   Constitutional: Negative for fever.   HENT: Positive for ear pain (left) and sore throat. Negative for dental problem.    Musculoskeletal: Positive for neck pain (left).   All other systems reviewed and are negative.      Physical Exam     Vitals:    02/05/17 1057   BP: 122/86   Pulse: 87   Resp: 16   Temp: 98.5 ??F (36.9 ??C)   SpO2: 100%   Weight: 72.6 kg (160 lb)   Height: 5\' 4"  (1.626 m)     Physical Exam   Constitutional: She appears well-developed and well-nourished. No  distress.   HENT:   Head: Normocephalic and atraumatic.   Right Ear: External ear normal.   Left Ear: External ear normal.   Nose: Nose normal.   Mouth/Throat: Oropharynx is clear and moist. No oropharyngeal exudate.   Left external ear is tender with manipulation. The canal appears normal. TM is unremarkable. Left mastoid is non-tender to palpation. Right ear and TM are unremarkable.  Tonsils are symmetric. Uvula is midline. Airway is patent.  No localized gingival induration or fluctuance to suggest drainable dental abscess.   No sublingual or submandibular swelling, fluctuance or gingival bleeding.  No swelling or elevation of the tongue. No facial swelling. No cervical induration and patient with full ROM of neck.     Eyes: Conjunctivae are normal.   Neck: Normal range of motion.   Cardiovascular: Normal rate, regular rhythm and normal heart sounds.    Pulmonary/Chest: Effort normal and breath sounds normal. No respiratory distress. She has no wheezes.   Lymphadenopathy:     She has no cervical adenopathy.   Neurological: She is alert.   Skin:  Skin is warm and dry. She is not diaphoretic.   Psychiatric: She has a normal mood and affect.   Vitals reviewed.        Diagnostic Study Results     Labs -   No results found for this or any previous visit (from the past 12 hour(s)).    Radiologic Studies -   No orders to display     CT Results  (Last 48 hours)    None        CXR Results  (Last 48 hours)    None          Medications given in the ED-  Medications - No data to display      Medical Decision Making   I am the first provider for this patient.    I reviewed the vital signs, available nursing notes, past medical history, past surgical history, family history and social history.    Vital Signs-Reviewed the patient's vital signs.    Records Reviewed: Nursing Notes and Triage notes     Provider Notes (Medical Decision Making): Appears well and non-toxic,  presenting with left ear pain with referred pain to the left throat/neck. Ear is tender to manipulation and TM is clear, will tx for possible developing OE. No s/sx of dental or deep space infection or mastoiditis. Based on today's assessment, I feel the patient is stable for discharge to home with outpatient follow up. Return precautions and referrals provided.     Procedures:  Procedures      Diagnosis and Disposition       DISCHARGE NOTE:  11:08 AM  Marissa Kirby's  results have been reviewed with her.  She has been counseled regarding her diagnosis, treatment, and plan.  She verbally conveys understanding and agreement of the signs, symptoms, diagnosis, treatment and prognosis and additionally agrees to follow up as discussed.  She also agrees with the care-plan and conveys that all of her questions have been answered.  I have also provided discharge instructions for her that include: educational information regarding their diagnosis and treatment, and list of reasons why they would want to return to the ED prior to their follow-up appointment, should her condition change. She has been provided with education for proper emergency department utilization.     CLINICAL IMPRESSION:    1. Left ear pain        PLAN:  1. D/C Home  2.   Current Discharge Medication List      START taking these medications    Details   neomycin-colist-hydrocortisone-thonzonium (CORTISPORIN-TC) otic suspension Administer 3 Drops in left ear four (4) times daily for 7 days.  Qty: 5 mL, Refills: 0      acetaminophen (TYLENOL) 325 mg tablet Take 2 Tabs by mouth every four (4) hours as needed for Pain.  Qty: 20 Tab, Refills: 0           3.   Follow-up Information     Follow up With Details Comments Contact Info    [facility] EMERGENCY DEPT Go to If symptoms worsen [address]  [place] [address]  [phone]          Scribe Attestation     Clelia Croft acting as a scribe for and in the presence of Francene Boyers, [place]       February 05, 2017 at 10:58 AM       Provider Attestation:      I personally performed the services described in  the documentation, reviewed the documentation, as recorded by the scribe in my presence, and it accurately and completely records my words and actions. February 05, 2017 at 10:58 AM - Francene Boyers, PA

## 2017-02-05 NOTE — ED Triage Notes (Signed)
"  My left ear and down my neck is hurting so bad."

## 2017-02-05 NOTE — ED Notes (Signed)
I have reviewed discharge instructions with the patient.  The patient verbalized understanding.    Patient armband removed and given to patient to take home.  Patient was informed of the privacy risks if armband lost or stolen.    Pt discharged in NAD.

## 2020-02-20 ENCOUNTER — Encounter: Attending: Family | Primary: Student in an Organized Health Care Education/Training Program

## 2020-08-16 ENCOUNTER — Telehealth: Attending: Family | Primary: Family

## 2020-08-16 ENCOUNTER — Telehealth
Admit: 2020-08-16 | Discharge: 2020-08-16 | Payer: PRIVATE HEALTH INSURANCE | Attending: Family | Primary: Student in an Organized Health Care Education/Training Program

## 2020-08-16 DIAGNOSIS — Z8269 Family history of other diseases of the musculoskeletal system and connective tissue: Secondary | ICD-10-CM

## 2020-08-16 NOTE — Progress Notes (Signed)
Progress Notes by Mordecai Maes, NP at 08/16/20 0945                Author: Mordecai Maes, NP  Service: --  Author Type: Nurse Practitioner       Filed: 08/27/20 1247  Encounter Date: 08/16/2020  Status: Signed          Editor: Mordecai Maes, NP (Nurse Practitioner)               Marissa Kirby (DOB: [birth year] ) is a 30 y.o. female, established  patient, here for evaluation of the following chief complaint(s):    Establish Care (Patient wants to know if she can be tested for Lupus, mom has it ), Leg Pain, Foot Pain, and Headache (migraine, 3 to 4 a month )    LNMP 07-29-2020   Mirena IUD-recently removed      PAP 02-09-2020; fill medical release form       Mom has lupus and aunt recently diagnosed with SLE. Pt would like to be checked for lupus.      Migraines about 3-4/month; complains of foot pain         ASSESSMENT/PLAN:   Below is the assessment and plan developed based on review of pertinent history, labs, studies, and medications.      1. Family history of systemic lupus erythematosus (SLE) in mother   -     SYSTEMIC LUPUS PROFILE A; Future   -     C REACTIVE PROTEIN, QT; Future   -     SED RATE (ESR); Future   2. Intractable chronic migraine without aura and with status migrainosus   3. History of gestational diabetes   -     HEMOGLOBIN A1C WITH EAG; Future   -     METABOLIC PANEL, COMPREHENSIVE; Future   4. Family history of diabetes mellitus in father   -     HEMOGLOBIN A1C WITH EAG; Future   5. Chronic fatigue   -     CBC WITH AUTOMATED DIFF; Future   -     VITAMIN D, 25 HYDROXY; Future   -     TSH 3RD GENERATION; Future   6. Screening for hematuria or proteinuria   -     URINALYSIS W/ RFLX MICROSCOPIC; Future         Return in about 1 week (around 08/23/2020) for lab visit .      SUBJECTIVE/OBJECTIVE:   HPI      Review of Systems    Constitutional: Negative.     Respiratory: Negative for cough, chest tightness, shortness of breath and wheezing.     Cardiovascular: Negative for chest pain,  palpitations and leg swelling.    Musculoskeletal: Positive for arthralgias. Negative for back pain.    Neurological: Negative for dizziness.    Psychiatric/Behavioral: Negative for behavioral problems, dysphoric mood and sleep disturbance.           No data recorded       Physical Exam      [INSTRUCTIONS:  "[x] " Indicates a positive item  "[]" Indicates a  negative item  -- DELETE ALL ITEMS NOT EXAMINED]      Constitutional: [x]  Appears well-developed and well-nourished [x] No apparent  distress       []  Abnormal -       Mental status: [x] Alert and awake  [x] Oriented to person/place/time [x]  Able to follow commands     []  Abnormal -       Eyes:   EOM    [x]  Normal    [] Abnormal -    Sclera  [x]   Normal    [] Abnormal -           Discharge [x]   None visible   [] Abnormal -       HENT: [x] Normocephalic, atraumatic  [] Abnormal -    [x] Mouth/Throat: Mucous membranes  are moist      External Ears [x] Normal  [] Abnormal -      Neck: [x] No visualized mass [] Abnormal -       Pulmonary/Chest: [x] Respiratory effort normal   [x] No visualized signs of difficulty breathing or respiratory distress         []  Abnormal -        Musculoskeletal:   [x]  Normal gait with no signs of ataxia          [x]  Normal range of motion of neck         []  Abnormal -       Neurological:        [x]  No Facial Asymmetry (Cranial nerve 7 motor function) (limited exam due to video visit)           [x]  No gaze palsy         [] Abnormal -            Skin:        [x]  No significant exanthematous lesions or discoloration noted on facial skin          []  Abnormal -              Psychiatric:       [x] Normal Affect [] Abnormal -         [x]  No Hallucinations      Other pertinent observable physical exam findings:-            On this date 08/16/2020 I have spent 5 minutes reviewing  previous notes, test results and face to face (virtual) with the patient discussing the diagnosis and importance of compliance with the treatment plan  as well as documenting on the day of the visit.      Marissa Kirby, was evaluated through a synchronous (real-time) audio-video encounter. The patient (or guardian if applicable) is aware that this is a billable service, which  includes applicable co-pays. Verbal consent to proceed has been obtained. The visit was conducted pursuant to the emergency declaration under the [place] waiver authority and the [employer] and  [employer] Act.  Patient identification was verified, and a caregiver was present when appropriate. The patient was located at home in a state where the provider was licensed to provide care.          An electronic signature was used to authenticate this note.   -- Mordecai Maes, NP

## 2020-08-16 NOTE — Progress Notes (Signed)
Progress  Notes by Chelsea Aus at 08/16/20 0945                Author: Chelsea Aus  Service: --  Author Type: Medical Assistant       Filed: 08/16/20 0935  Encounter Date: 08/16/2020  Status: Signed          Editor: Chelsea Aus (Medical Assistant)               Patient states she has not had the Flu vaccine or Covid vaccine. Patient states last menstrual cycle was July 29, 2020.     Marissa Kirby presents today for      Chief Complaint       Patient presents with        ?  Establish Care             Patient wants to know if she can be tested for Lupus, mom has it         ?  Leg Pain     ?  Foot Pain     ?  Headache             migraine, 3 to 4 a month            Virtual/telephone visit      Depression Screening:      3 most recent PHQ Screens  08/16/2020        Little interest or pleasure in doing things  Not at all     Feeling down, depressed, irritable, or hopeless  Not at all        Total Score PHQ 2  0           Learning Assessment:      Learning Assessment  08/16/2020        PRIMARY LEARNER  Patient     HIGHEST LEVEL OF EDUCATION - PRIMARY LEARNER   GRADUATED HIGH SCHOOL OR GED     BARRIERS PRIMARY LEARNER  NONE     CO-LEARNER CAREGIVER  No     PRIMARY LANGUAGE  ENGLISH     LEARNER PREFERENCE PRIMARY  DEMONSTRATION     ANSWERED BY  Patient        RELATIONSHIP  SELF           Health Maintenance reviewed and discussed and ordered per Provider.        Health Maintenance Due        Topic  Date Due         ?  Depression Screen   Never done     ?  COVID-19 Vaccine (1)  Never done     ?  Pap Smear   Never done         ?  Flu Vaccine (1)  Never done     .        Coordination of Care:   1. Have you been to the ER, urgent care clinic since your last visit? Hospitalized since your last visit? no      2. Have you seen or consulted any other health care providers outside of the [facility] since your last visit? Include any pap smears or colon screening.  Yes [facility]  February 2022.         3. For patients aged 53-75: Has the patient had a colonoscopy / FIT/ Cologuard? NA  - based on age         If the  patient is female:      4. For patients aged 87-74: Has the patient had a mammogram within the past 2 years?  NA - based on age or sex         46. For patients aged 21-65: Has the patient had a pap smear? No  Patient states she had this February 09, 2020 [facility]

## 2020-08-19 ENCOUNTER — Encounter: Primary: Student in an Organized Health Care Education/Training Program

## 2020-10-21 ENCOUNTER — Encounter: Admit: 2020-10-21 | Discharge: 2020-10-21 | Payer: PRIVATE HEALTH INSURANCE | Primary: Family

## 2020-10-21 DIAGNOSIS — Z1389 Encounter for screening for other disorder: Secondary | ICD-10-CM

## 2020-10-22 LAB — COMPREHENSIVE METABOLIC PANEL
ALT: 36 IU/L — ABNORMAL HIGH (ref 0–32)
AST: 35 IU/L (ref 0–40)
Albumin/Globulin Ratio: 1.6 NA (ref 1.2–2.2)
Albumin: 4.5 g/dL (ref 3.9–5.0)
Alkaline Phosphatase: 109 IU/L (ref 44–121)
BUN: 10 mg/dL (ref 6–20)
Bun/Cre Ratio: 13 NA (ref 9–23)
CO2: 20 mmol/L (ref 20–29)
Calcium: 9.4 mg/dL (ref 8.7–10.2)
Chloride: 104 mmol/L (ref 96–106)
Creatinine: 0.75 mg/dL (ref 0.57–1.00)
Est, Glomerular Filtration Rate: 110 mL/min/{1.73_m2} (ref >59)
Globulin, Total: 2.9 g/dL (ref 1.5–4.5)
Glucose: 100 mg/dL — ABNORMAL HIGH (ref 65–99)
Potassium: 4.8 mmol/L (ref 3.5–5.2)
Sodium: 139 mmol/L (ref 134–144)
Total Bilirubin: 0.2 mg/dL (ref 0.0–1.2)
Total Protein: 7.4 g/dL (ref 6.0–8.5)

## 2020-10-22 LAB — URINALYSIS W/ RFLX MICROSCOPIC
Bilirubin, Urine: NEGATIVE
Bilirubin: NEGATIVE
Blood, Urine: NEGATIVE
Blood: NEGATIVE
Glucose, UA: NEGATIVE
Glucose: NEGATIVE
Ketone: NEGATIVE
Ketones, Urine: NEGATIVE
Leukocyte Esterase, Urine: NEGATIVE
Leukocyte Esterase: NEGATIVE
Nitrite, Urine: NEGATIVE
Nitrites: NEGATIVE
Protein, UA: NEGATIVE
Protein: NEGATIVE
Specific Gravity, UA: 1.023 NA (ref 1.005–1.030)
Specific Gravity: 1.023 (ref 1.005–1.030)
Urobilinogen, Urine: 0.2 mg/dL (ref 0.2–1.0)
Urobilinogen: 0.2 mg/dL (ref 0.2–1.0)
pH (UA): 5.5 (ref 5.0–7.5)
pH, UA: 5.5 NA (ref 5.0–7.5)

## 2020-10-22 LAB — CBC WITH AUTO DIFFERENTIAL
Basophils %: 0 %
Basophils Absolute: 0 10*3/uL (ref 0.0–0.2)
Eosinophils %: 4 %
Eosinophils Absolute: 0.4 10*3/uL (ref 0.0–0.4)
Granulocyte Absolute Count: 0 10*3/uL (ref 0.0–0.1)
Hematocrit: 37.7 % (ref 34.0–46.6)
Hemoglobin: 11.5 g/dL (ref 11.1–15.9)
Immature Granulocytes: 0 %
Lymphocytes %: 32 %
Lymphocytes Absolute: 3.3 10*3/uL — ABNORMAL HIGH (ref 0.7–3.1)
MCH: 24.1 pg — ABNORMAL LOW (ref 26.6–33.0)
MCHC: 30.5 g/dL — ABNORMAL LOW (ref 31.5–35.7)
MCV: 79 fL (ref 79–97)
Monocytes %: 5 %
Monocytes Absolute: 0.5 10*3/uL (ref 0.1–0.9)
Neutrophils %: 59 %
Neutrophils Absolute: 6 10*3/uL (ref 1.4–7.0)
Platelets: 275 10*3/uL (ref 150–450)
RBC: 4.78 x10E6/uL (ref 3.77–5.28)
RDW: 14.6 % (ref 11.7–15.4)
WBC: 10.3 10*3/uL (ref 3.4–10.8)

## 2020-10-22 LAB — C-REACTIVE PROTEIN: CRP: 24 mg/L — ABNORMAL HIGH (ref 0–10)

## 2020-10-22 LAB — TSH 3RD GENERATION
TSH: 1.64 u[IU]/mL (ref 0.450–4.500)
TSH: 1.64 u[IU]/mL (ref 0.450–4.500)

## 2020-10-22 LAB — HEMOGLOBIN A1C W/EAG
Hemoglobin A1C: 5.8 % — ABNORMAL HIGH (ref 4.8–5.6)
eAG: 120 mg/dL

## 2020-10-22 LAB — SEDIMENTATION RATE: Sed Rate: 9 mm/hr (ref 0–32)

## 2020-10-22 LAB — VITAMIN D 25 HYDROXY: Vit D, 25-Hydroxy: 14.8 ng/mL — ABNORMAL LOW (ref 30.0–100.0)

## 2020-10-22 LAB — CBC WITH AUTOMATED DIFF
ABS. BASOPHILS: 0 10*3/uL (ref 0.0–0.2)
ABS. EOSINOPHILS: 0.4 10*3/uL (ref 0.0–0.4)
ABS. IMM. GRANS.: 0 10*3/uL (ref 0.0–0.1)
ABS. MONOCYTES: 0.5 10*3/uL (ref 0.1–0.9)
ABS. NEUTROPHILS: 6 10*3/uL (ref 1.4–7.0)
Abs Lymphocytes: 3.3 10*3/uL — ABNORMAL HIGH (ref 0.7–3.1)
BASOPHILS: 0 %
EOSINOPHILS: 4 %
HCT: 37.7 % (ref 34.0–46.6)
HGB: 11.5 g/dL (ref 11.1–15.9)
IMMATURE GRANULOCYTES: 0 %
Lymphocytes: 32 %
MCH: 24.1 pg — ABNORMAL LOW (ref 26.6–33.0)
MCHC: 30.5 g/dL — ABNORMAL LOW (ref 31.5–35.7)
MCV: 79 fL (ref 79–97)
MONOCYTES: 5 %
NEUTROPHILS: 59 %
PLATELET: 275 10*3/uL (ref 150–450)
RBC: 4.78 x10E6/uL (ref 3.77–5.28)
RDW: 14.6 % (ref 11.7–15.4)
WBC: 10.3 10*3/uL (ref 3.4–10.8)

## 2020-10-22 LAB — METABOLIC PANEL, COMPREHENSIVE
A-G Ratio: 1.6 (ref 1.2–2.2)
ALT (SGPT): 36 IU/L — ABNORMAL HIGH (ref 0–32)
AST (SGOT): 35 IU/L (ref 0–40)
Albumin: 4.5 g/dL (ref 3.9–5.0)
Alk. phosphatase: 109 IU/L (ref 44–121)
BUN/Creatinine ratio: 13 (ref 9–23)
BUN: 10 mg/dL (ref 6–20)
Bilirubin, total: 0.2 mg/dL (ref 0.0–1.2)
CO2: 20 mmol/L (ref 20–29)
Calcium: 9.4 mg/dL (ref 8.7–10.2)
Chloride: 104 mmol/L (ref 96–106)
Creatinine: 0.75 mg/dL (ref 0.57–1.00)
GLOBULIN, TOTAL: 2.9 g/dL (ref 1.5–4.5)
Glucose: 100 mg/dL — ABNORMAL HIGH (ref 65–99)
Potassium: 4.8 mmol/L (ref 3.5–5.2)
Protein, total: 7.4 g/dL (ref 6.0–8.5)
Sodium: 139 mmol/L (ref 134–144)
eGFR: 110 mL/min/{1.73_m2} (ref >59)

## 2020-10-22 LAB — SYSTEMIC LUPUS PROFILE A
Anti-DNA (DS) Ab, QT: <1 IU/mL (ref 0–9)
Antichromatin Ab: <0.2 AI (ref 0.0–0.9)
RNP Abs: <0.2 AI (ref 0.0–0.9)
Rheumatoid factor: <10 IU/mL (ref <14.0)
Sjogren's Anti-SS-A: <0.2 AI (ref 0.0–0.9)
Sjogren's Anti-SS-B: <0.2 AI (ref 0.0–0.9)
Smith Abs: <0.2 AI (ref 0.0–0.9)

## 2020-10-22 LAB — HEMOGLOBIN A1C WITH EAG
Estimated average glucose: 120 mg/dL
Hemoglobin A1c: 5.8 % — ABNORMAL HIGH (ref 4.8–5.6)

## 2020-10-22 LAB — C REACTIVE PROTEIN, QT: C-Reactive Protein, Qt: 24 mg/L — ABNORMAL HIGH (ref 0–10)

## 2020-10-22 LAB — VITAMIN D, 25 HYDROXY: VITAMIN D, 25-HYDROXY: 14.8 ng/mL — ABNORMAL LOW (ref 30.0–100.0)

## 2020-10-22 LAB — SED RATE (ESR): Sed rate (ESR): 9 mm/hr (ref 0–32)

## 2020-10-25 ENCOUNTER — Encounter

## 2020-10-25 MED ORDER — ERGOCALCIFEROL (VITAMIN D2) 50,000 UNIT CAP
1250 mcg (50,000 unit) | ORAL_CAPSULE | ORAL | 1 refills | Status: AC
Start: 2020-10-25 — End: ?

## 2020-11-04 ENCOUNTER — Institutional Professional Consult (permissible substitution): Admit: 2020-11-04 | Discharge: 2020-11-04 | Payer: MEDICAID | Attending: Family | Primary: Family

## 2020-11-04 DIAGNOSIS — N926 Irregular menstruation, unspecified: Secondary | ICD-10-CM

## 2020-11-04 LAB — AMB POC URINE PREGNANCY TEST, VISUAL COLOR COMPARISON
HCG urine, Ql. (POC): NEGATIVE
HCG, Pregnancy, Urine, POC: NEGATIVE

## 2020-11-04 NOTE — Progress Notes (Signed)
Patient is here for a pregnancy test. Patient last menstrual cycle was April 2022.     Patient states she stop taking Birth control in January 2022.

## 2020-12-03 ENCOUNTER — Ambulatory Visit: Attending: Family | Primary: Family

## 2020-12-03 ENCOUNTER — Ambulatory Visit: Admit: 2020-12-03 | Discharge: 2020-12-03 | Payer: MEDICAID | Attending: Family | Primary: Family

## 2020-12-03 DIAGNOSIS — Z8632 Personal history of gestational diabetes: Secondary | ICD-10-CM

## 2020-12-03 NOTE — Progress Notes (Signed)
The Marissa Kirby 30 y.o. female presents today for:    Chief Complaint   Patient presents with   ??? Hospital Follow Up   ??? Foot Pain     LEFT     Patient did not take 10 day course of Penicillin because had blood test and tested positive for pregnancy  --denies any further throat pain or swelling  --denies fever or chills     Previous pregnancy test was negative on 11/04/2020 in this office  --pt had negative urine test at [facility] but positive blood test    --Pt has [facility] OB[facility] appointment on 7/22    Advised if having any cramping/abdominal bleeding to call 911 or go to ER.    Pt taking prenatal vitamins     Review of Systems   Constitutional: Negative.  Negative for chills, fever, malaise/fatigue and weight loss.   Cardiovascular: Negative for chest pain, palpitations and leg swelling.   Gastrointestinal: Positive for abdominal pain. Negative for blood in stool, constipation and vomiting.        Round ligament pain; denies cramping or vaginal bleeding    Genitourinary: Negative for frequency, hematuria and urgency.   Musculoskeletal: Positive for joint pain.        Left heel pain   Skin: Negative.    Neurological: Negative for dizziness and headaches.   Psychiatric/Behavioral: Negative for depression and suicidal ideas. The patient is not nervous/anxious and does not have insomnia.        Health Maintenance Due   Topic Date Due   ??? Hepatitis C Screening  Never done   ??? COVID-19 Vaccine (1) Never done   ??? Pap Smear  Never done        History reviewed. No pertinent past medical history.    Physical Exam  Constitutional:       General: She is not in acute distress.     Appearance: She is obese. She is not toxic-appearing.   HENT:      Mouth/Throat:      Mouth: Mucous membranes are moist.      Pharynx: Oropharynx is clear. Uvula midline.      Tonsils: No tonsillar exudate or tonsillar abscesses.   Cardiovascular:      Rate and Rhythm: Normal rate and regular rhythm.      Pulses: Normal pulses.   Pulmonary:       Effort: Pulmonary effort is normal.      Breath sounds: Normal breath sounds.   Abdominal:      General: There is no distension.      Palpations: Abdomen is soft.      Tenderness: There is no abdominal tenderness. There is no guarding or rebound.   Musculoskeletal:      Right lower leg: No edema.      Left lower leg: No edema.   Neurological:      General: No focal deficit present.      Mental Status: She is alert and oriented to person, place, and time.   Psychiatric:         Mood and Affect: Mood normal.        Visit Vitals  BP 118/77 (BP 1 Location: Left upper arm, BP Patient Position: Sitting)   Pulse 94   Temp 98.3 ??F (36.8 ??C) (Temporal)   Resp 16   Ht 5\' 4"  (1.626 m)   Wt 207 lb 6.4 oz (94.1 kg)   SpO2 98%   BMI 35.60 kg/m??  Current Outpatient Medications on File Prior to Visit   Medication Sig Dispense Refill   ??? [id number]-dha-epa-fish oil 400 mcg-35 mg -25 mg-5 mg chew Take  by mouth.     ??? ergocalciferol (ERGOCALCIFEROL) 1,250 mcg (50,000 unit) capsule Take 1 Capsule by mouth every seven (7) days. 12 Capsule 1     No current facility-administered medications on file prior to visit.         ASSESSMENT and PLAN    ICD-10-CM ICD-9-CM    1. History of gestational diabetes  Z86.32 V12.21    2. Pre-diabetes  R73.03 790.29    3. Plantar fasciitis of left foot  M72.2 728.71    4. Pregnancy, unspecified gestational age  Z35.90 V22.2      Follow-up and Dispositions    ?? Return in about 6 months (around 06/05/2021).       lab results and schedule of future lab studies reviewed with patient  reviewed diet, exercise and weight control  cardiovascular risk and specific lipid/LDL goals reviewed  reviewed medications and side effects in detail    Bluford Main, NP

## 2020-12-03 NOTE — Progress Notes (Signed)
Patient do not have covid vaccine   Beryle Lathe presents today for   Chief Complaint   Patient presents with   . Hospital Follow Up   . Foot Pain     LEFT       Is someone accompanying this pt? NO    Is the patient using any DME equipment during OV? NO    Depression Screening:  3 most recent PHQ Screens 12/03/2020   Little interest or pleasure in doing things Not at all   Feeling down, depressed, irritable, or hopeless Not at all   Total Score PHQ 2 0       Learning Assessment:  Learning Assessment 08/16/2020   PRIMARY LEARNER Patient   HIGHEST LEVEL OF EDUCATION - PRIMARY LEARNER  GRADUATED HIGH SCHOOL OR GED   BARRIERS PRIMARY LEARNER NONE   CO-LEARNER CAREGIVER No   PRIMARY LANGUAGE ENGLISH   LEARNER PREFERENCE PRIMARY DEMONSTRATION   ANSWERED BY Patient   RELATIONSHIP SELF       Health Maintenance reviewed and discussed and ordered per Provider.    Health Maintenance Due   Topic Date Due   . Hepatitis C Screening  Never done   . COVID-19 Vaccine (1) Never done   . Pap Smear  Never done   .      Coordination of Care:  1. Have you been to the ER, urgent care clinic since your last visit? Hospitalized since your last visit? Yes [facility] 11/30/20    2. Have you seen or consulted any other health care providers outside of the [facility] since your last visit? Include any pap smears or colon screening. No      3. For patients aged 34-75: Has the patient had a colonoscopy / FIT/ Cologuard? NA - based on age      If the patient is female:    4. For patients aged 5-74: Has the patient had a mammogram within the past 2 years? NA - based on age or sex      44. For patients aged 21-65: Has the patient had a pap smear? No Patient states her last pap was done January 2022.

## 2021-05-06 ENCOUNTER — Encounter: Attending: Family | Primary: Family

## 2022-12-28 NOTE — Progress Notes (Signed)
Patient had baby on 12-19-2022.
